# Patient Record
Sex: Female | Born: 1945 | Race: Black or African American | Hispanic: No | Smoking: Former smoker
Health system: Southern US, Community
[De-identification: ages and names within clinical notes are randomized; demographics above are authoritative.]

## PROBLEM LIST (undated history)

## (undated) DIAGNOSIS — I10 Essential (primary) hypertension: Secondary | ICD-10-CM

## (undated) DIAGNOSIS — K5909 Other constipation: Secondary | ICD-10-CM

## (undated) DIAGNOSIS — K219 Gastro-esophageal reflux disease without esophagitis: Secondary | ICD-10-CM

## (undated) DIAGNOSIS — M797 Fibromyalgia: Secondary | ICD-10-CM

## (undated) DIAGNOSIS — Z973 Presence of spectacles and contact lenses: Secondary | ICD-10-CM

## (undated) DIAGNOSIS — M1990 Unspecified osteoarthritis, unspecified site: Secondary | ICD-10-CM

## (undated) DIAGNOSIS — E119 Type 2 diabetes mellitus without complications: Secondary | ICD-10-CM

## (undated) HISTORY — DX: Type 2 diabetes mellitus without complications: E11.9

## (undated) HISTORY — PX: EYE SURGERY: SHX253

## (undated) HISTORY — PX: CYSTOCELE REPAIR: SHX163

## (undated) HISTORY — PX: TUBAL LIGATION: SHX77

## (undated) HISTORY — DX: Essential (primary) hypertension: I10

## (undated) HISTORY — PX: COLONOSCOPY: SHX174

---
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
## 2009-05-15 ENCOUNTER — Emergency Department (HOSPITAL_COMMUNITY): Admission: EM | Admit: 2009-05-15 | Discharge: 2009-05-15 | Payer: Self-pay | Admitting: Family Medicine

## 2010-03-06 ENCOUNTER — Encounter: Admission: RE | Admit: 2010-03-06 | Discharge: 2010-03-06 | Payer: Self-pay | Admitting: Family Medicine

## 2011-08-01 ENCOUNTER — Ambulatory Visit
Admission: RE | Admit: 2011-08-01 | Discharge: 2011-08-01 | Disposition: A | Discharge status: Home / Self Care | Payer: Medicare Other | Source: Ambulatory Visit | Attending: Family Medicine | Admitting: Family Medicine

## 2011-08-01 ENCOUNTER — Other Ambulatory Visit: Payer: Self-pay | Admitting: Family Medicine

## 2011-08-01 DIAGNOSIS — M545 Low back pain: Secondary | ICD-10-CM

## 2011-08-14 ENCOUNTER — Ambulatory Visit (INDEPENDENT_AMBULATORY_CARE_PROVIDER_SITE_OTHER): Payer: Medicare Other | Admitting: Family Medicine

## 2011-08-14 ENCOUNTER — Ambulatory Visit: Payer: Medicare Other

## 2011-08-14 VITALS — BP 233/98 | HR 65 | Temp 98.4°F | Resp 18 | Ht 63.75 in | Wt 242.0 lb

## 2011-08-14 DIAGNOSIS — R0602 Shortness of breath: Secondary | ICD-10-CM

## 2011-08-14 DIAGNOSIS — I10 Essential (primary) hypertension: Secondary | ICD-10-CM

## 2011-08-14 NOTE — Progress Notes (Signed)
  Subjective:    Patient ID: Nicole Abbott, female    DOB: 12-13-1945, 66 y.o.   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  Started toprol for high blood pressure 2 weeks ago and symptoms started then.  Was a change from enalapril because it wasn't working.  States pressure was even worse than it is today.  Has been in 200s for almost a year, per the patient.  Also tried benicar-hctz and didn't tolerate.  MD didn't do any bloodwork.  Patient bothered by her out of control HTN and change in breathing comfort. Still taking the toprol.      Has lived here 2 years.     Review of Systems No chest pain or headache.      Objective:   Physical Exam  Constitutional: She appears well-developed and well-nourished.  Cardiovascular: Normal rate, regular rhythm, normal heart sounds and intact distal pulses.   No murmur heard. Pulmonary/Chest: Effort normal and breath sounds normal. No respiratory distress. She has no wheezes. She has no rales.  Skin: Skin is warm and dry.    EKG:  LVH.  Sinus rhythm.  Normal rate. No S-T changes.   [facility] Primary radiology reading by Dr. Georgiana Shore: Congestion.  Fluid in fissure on right. No discrete effusion      Assessment & Plan:  Hypertension - CHRONIC.  Per patient (and reliable - medically trained) has been running this high for months.  No chest pain, headache.  Evidence of congestion on xray but breathing comfortably, lungs sound clear.  Goal is to lower slowly over several days.  Continue toprol.  Start Lasix 20 daily (has this at home.  Takes 1/2 periodically for LE swelling).  In 3 days resume enalapril (took 20 BID).  Start daily.  Then in 3 more days go BID.  Monitor BPs twice daily and record.  Will touch base with her by phone on Sunday to review labs (CMET to eval liver, kidneys and BNP) and see what BPs are and further advise.  If develops chest pain, ha, vision changes, nausea patient to go to ED immediately.  She  understands and agrees.

## 2011-08-15 LAB — COMPREHENSIVE METABOLIC PANEL
AST: 30 U/L (ref 0–37)
Albumin: 4.2 g/dL (ref 3.5–5.2)
BUN: 12 mg/dL (ref 6–23)
CO2: 31 mEq/L (ref 19–32)
Calcium: 9.8 mg/dL (ref 8.4–10.5)
Chloride: 103 mEq/L (ref 96–112)
Glucose, Bld: 92 mg/dL (ref 70–99)
Potassium: 4.6 mEq/L (ref 3.5–5.3)

## 2011-08-15 LAB — BRAIN NATRIURETIC PEPTIDE: Brain Natriuretic Peptide: 41.5 pg/mL (ref 0.0–100.0)

## 2011-08-16 ENCOUNTER — Other Ambulatory Visit: Payer: Self-pay

## 2011-08-16 ENCOUNTER — Emergency Department (HOSPITAL_COMMUNITY): Payer: Medicare Other

## 2011-08-16 ENCOUNTER — Observation Stay (HOSPITAL_COMMUNITY)
Admission: EM | Admit: 2011-08-16 | Discharge: 2011-08-19 | Disposition: A | Discharge status: Home / Self Care | Payer: Medicare Other | Attending: Internal Medicine | Admitting: Internal Medicine

## 2011-08-16 ENCOUNTER — Encounter (HOSPITAL_COMMUNITY): Payer: Self-pay | Admitting: Emergency Medicine

## 2011-08-16 DIAGNOSIS — I5032 Chronic diastolic (congestive) heart failure: Secondary | ICD-10-CM | POA: Insufficient documentation

## 2011-08-16 DIAGNOSIS — M129 Arthropathy, unspecified: Secondary | ICD-10-CM | POA: Insufficient documentation

## 2011-08-16 DIAGNOSIS — J45909 Unspecified asthma, uncomplicated: Secondary | ICD-10-CM | POA: Diagnosis not present

## 2011-08-16 DIAGNOSIS — R0789 Other chest pain: Principal | ICD-10-CM | POA: Insufficient documentation

## 2011-08-16 DIAGNOSIS — E785 Hyperlipidemia, unspecified: Secondary | ICD-10-CM | POA: Diagnosis present

## 2011-08-16 DIAGNOSIS — I10 Essential (primary) hypertension: Secondary | ICD-10-CM | POA: Diagnosis present

## 2011-08-16 DIAGNOSIS — Z8249 Family history of ischemic heart disease and other diseases of the circulatory system: Secondary | ICD-10-CM | POA: Insufficient documentation

## 2011-08-16 DIAGNOSIS — R079 Chest pain, unspecified: Secondary | ICD-10-CM | POA: Diagnosis present

## 2011-08-16 DIAGNOSIS — K219 Gastro-esophageal reflux disease without esophagitis: Secondary | ICD-10-CM | POA: Insufficient documentation

## 2011-08-16 DIAGNOSIS — K5900 Constipation, unspecified: Secondary | ICD-10-CM | POA: Insufficient documentation

## 2011-08-16 DIAGNOSIS — M1990 Unspecified osteoarthritis, unspecified site: Secondary | ICD-10-CM | POA: Diagnosis present

## 2011-08-16 DIAGNOSIS — R7309 Other abnormal glucose: Secondary | ICD-10-CM | POA: Insufficient documentation

## 2011-08-16 DIAGNOSIS — R0602 Shortness of breath: Secondary | ICD-10-CM | POA: Insufficient documentation

## 2011-08-16 DIAGNOSIS — I509 Heart failure, unspecified: Secondary | ICD-10-CM | POA: Insufficient documentation

## 2011-08-16 HISTORY — DX: Unspecified osteoarthritis, unspecified site: M19.90

## 2011-08-16 LAB — COMPREHENSIVE METABOLIC PANEL
ALT: 23 U/L (ref 0–35)
Albumin: 3.5 g/dL (ref 3.5–5.2)
Alkaline Phosphatase: 81 U/L (ref 39–117)
Glucose, Bld: 175 mg/dL — ABNORMAL HIGH (ref 70–99)
Potassium: 3.8 mEq/L (ref 3.5–5.1)
Sodium: 142 mEq/L (ref 135–145)
Total Protein: 7.1 g/dL (ref 6.0–8.3)

## 2011-08-16 LAB — DIFFERENTIAL
Basophils Relative: 1 % (ref 0–1)
Eosinophils Absolute: 0.2 10*3/uL (ref 0.0–0.7)
Lymphs Abs: 2.3 10*3/uL (ref 0.7–4.0)
Neutro Abs: 2.2 10*3/uL (ref 1.7–7.7)
Neutrophils Relative %: 43 % (ref 43–77)

## 2011-08-16 LAB — CBC
MCH: 27.2 pg (ref 26.0–34.0)
Platelets: 305 10*3/uL (ref 150–400)
RBC: 4.48 MIL/uL (ref 3.87–5.11)

## 2011-08-16 MED ORDER — SODIUM CHLORIDE 0.9 % IV SOLN
INTRAVENOUS | Status: AC
  Administered 2011-08-17: 02:00:00 via INTRAVENOUS

## 2011-08-16 MED ORDER — NITROGLYCERIN 2 % TD OINT
1.0000 [in_us] | TOPICAL_OINTMENT | Freq: Once | TRANSDERMAL | Status: DC
  Filled 2011-08-16: qty 1

## 2011-08-16 NOTE — ED Provider Notes (Signed)
History     CSN: [id number]  Arrival date & time 08/16/11  2118   First MD Initiated Contact with Patient 08/16/11 2150      Chief Complaint  Patient presents with  . Chest Pain    (Consider location/radiation/quality/duration/timing/severity/associated sxs/prior treatment) Patient is a 66 y.o. female presenting with chest pain. The history is provided by the patient (The patient states that she's been having chest pain for last week but worse today and had some shortness of breath and sweating she was given 3 nitroglycerin by the paramedics which helped her pain.). No language interpreter was used.  Chest Pain Chest pain occurs frequently. The chest pain is resolved. The pain is associated with stress. At its most intense, the pain is at 6/10. The pain is currently at 3/10. The quality of the pain is described as aching. The pain does not radiate. Chest pain is worsened by stress. Pertinent negatives for primary symptoms include no fever, no fatigue, no cough and no abdominal pain.  Pertinent negatives for associated symptoms include no claudication.  Pertinent negatives for past medical history include no seizures.     Past Medical History  Diagnosis Date  . Hypertension   . Asthma   . Arthritis     Past Surgical History  Procedure Date  . Tubal ligation     Family History  Problem Relation Age of Onset  . Diabetes Mother   . Hypertension Mother   . Diabetes Sister   . Hypertension Sister     History  Substance Use Topics  . Smoking status: Former Games developer  . Smokeless tobacco: Not on file  . Alcohol Use: Yes     Occasional wine drinker    OB History    Grav Para Term Preterm Abortions TAB SAB Ect Mult Living                  Review of Systems  Constitutional: Negative for fever and fatigue.  HENT: Negative for congestion, sinus pressure and ear discharge.   Eyes: Negative for discharge.  Respiratory: Negative for cough.   Cardiovascular: Positive for  chest pain. Negative for claudication.  Gastrointestinal: Negative for abdominal pain and diarrhea.  Genitourinary: Negative for frequency and hematuria.  Musculoskeletal: Negative for back pain.  Skin: Negative for rash.  Neurological: Negative for seizures and headaches.  Hematological: Negative.   Psychiatric/Behavioral: Negative for hallucinations.    Allergies  Penicillins  Home Medications   Current Outpatient Rx  Name Route Sig Dispense Refill  . DOCUSATE SODIUM-CASANTHRANOL 100-30 MG PO CAPS Oral Take 3 capsules by mouth daily.     . ENALAPRIL MALEATE 20 MG PO TABS Oral Take 20 mg by mouth daily.    Marland Kitchen FAMOTIDINE 10 MG PO CHEW Oral Chew 10 mg by mouth 2 (two) times daily.    . FUROSEMIDE 20 MG PO TABS Oral Take 10 mg by mouth daily.    Marland Kitchen MAGNESIUM OXIDE 400 MG PO TABS Oral Take 400 mg by mouth at bedtime.    Marland Kitchen METOPROLOL TARTRATE 50 MG PO TABS Oral Take 50 mg by mouth 2 (two) times daily.    . MULTI-VITAMIN/MINERALS PO TABS Oral Take 1 tablet by mouth daily.    Marland Kitchen NAPROXEN SODIUM 220 MG PO TABS Oral Take 220 mg by mouth 2 (two) times daily with a meal.      BP 194/69  Pulse 65  Temp 97.9 F (36.6 C)  Resp 16  SpO2 100%  Physical Exam  Constitutional: She is oriented to person, place, and time. She appears well-developed.  HENT:  Head: Normocephalic and atraumatic.  Eyes: Conjunctivae and EOM are normal. No scleral icterus.  Neck: Neck supple. No thyromegaly present.  Cardiovascular: Normal rate and regular rhythm.  Exam reveals no gallop and no friction rub.   No murmur heard. Pulmonary/Chest: No stridor. She has no wheezes. She has no rales. She exhibits no tenderness.  Abdominal: She exhibits no distension. There is no tenderness. There is no rebound.  Musculoskeletal: Normal range of motion. She exhibits no edema.  Lymphadenopathy:    She has no cervical adenopathy.  Neurological: She is oriented to person, place, and time. Coordination normal.  Skin: No rash  noted. No erythema.  Psychiatric: She has a normal mood and affect. Her behavior is normal.    ED Course  Procedures (including critical care time)  Labs Reviewed  COMPREHENSIVE METABOLIC PANEL - Abnormal; Notable for the following:    Glucose, Bld 175 (*)    Total Bilirubin 0.1 (*)    GFR calc non Af Amer 85 (*)    All other components within normal limits  CBC  DIFFERENTIAL  POCT I-STAT TROPONIN I   Dg Chest Portable 1 View  08/16/2011  *RADIOLOGY REPORT*  Clinical Data: Shortness of breath.  PORTABLE CHEST - 1 VIEW  Comparison: 08/14/2011  Findings: Single view of the chest was obtained.  There is haziness in the lower lungs probably related to technique and soft tissues. There is no focal airspace disease.  Vascular structures are similar to the prior examination.  Heart size is stable and upper limits of normal.  No evidence for pneumothorax.  IMPRESSION: Haziness in the lower lungs is probably related to overlying soft tissues rather than a pulmonary process.  Minimal change from the prior examination and no focal disease.  Original Report Authenticated By: Richarda Overlie, M.D.     1. Chest pain    Date: 08/16/2011  Rate: 71  Rhythm: normal sinus rhythm  QRS Axis: normal  Intervals: normal  ST/T Wave abnormalities: nonspecific ST changes  Conduction Disutrbances:none  Narrative Interpretation:   Old EKG Reviewed: none available      MDM  Chest pain rule out cad        Benny Lennert, MD 08/16/11 2344

## 2011-08-16 NOTE — ED Notes (Addendum)
Per EMS:  Pt has been having CP for the past two weeks.  Went to Urgent Care and was told EKG was abnormal and referred to her PCM.  CP started tonight @ 2000 - administered 3 Nitro and 325 mg of asprin.  Pain radiates to left jaw, neck, arm, and back.  Rates pain 7/10.  Also has been having SOB.  Pt was placed on 2L of O2 via [place]  Urgent care stated she had fluid in heart and was started on toprol.  VS:  220/107 (per EMS)

## 2011-08-17 ENCOUNTER — Encounter (HOSPITAL_COMMUNITY): Payer: Self-pay | Admitting: *Deleted

## 2011-08-17 DIAGNOSIS — J45909 Unspecified asthma, uncomplicated: Secondary | ICD-10-CM | POA: Diagnosis not present

## 2011-08-17 DIAGNOSIS — R079 Chest pain, unspecified: Secondary | ICD-10-CM | POA: Diagnosis present

## 2011-08-17 DIAGNOSIS — M1990 Unspecified osteoarthritis, unspecified site: Secondary | ICD-10-CM | POA: Diagnosis present

## 2011-08-17 DIAGNOSIS — E785 Hyperlipidemia, unspecified: Secondary | ICD-10-CM | POA: Diagnosis present

## 2011-08-17 DIAGNOSIS — I10 Essential (primary) hypertension: Secondary | ICD-10-CM | POA: Diagnosis present

## 2011-08-17 LAB — CBC
HCT: 35.5 % — ABNORMAL LOW (ref 36.0–46.0)
HCT: 35.7 % — ABNORMAL LOW (ref 36.0–46.0)
MCH: 26.8 pg (ref 26.0–34.0)
MCH: 27.2 pg (ref 26.0–34.0)
MCHC: 33.2 g/dL (ref 30.0–36.0)
MCV: 80.7 fL (ref 78.0–100.0)
MCV: 81.5 fL (ref 78.0–100.0)
Platelets: 297 10*3/uL (ref 150–400)
RDW: 15 % (ref 11.5–15.5)
RDW: 15.2 % (ref 11.5–15.5)
WBC: 4.5 10*3/uL (ref 4.0–10.5)

## 2011-08-17 LAB — CARDIAC PANEL(CRET KIN+CKTOT+MB+TROPI)
CK, MB: 2.4 ng/mL (ref 0.3–4.0)
CK, MB: 2.3 ng/mL (ref 0.3–4.0)
Relative Index: 2.4 (ref 0.0–2.5)
Troponin I: <0.3 ng/mL (ref <0.30)
Troponin I: <0.3 ng/mL (ref <0.30)

## 2011-08-17 LAB — BASIC METABOLIC PANEL
BUN: 11 mg/dL (ref 6–23)
CO2: 28 mEq/L (ref 19–32)
Calcium: 9 mg/dL (ref 8.4–10.5)
Chloride: 104 mEq/L (ref 96–112)
Creatinine, Ser: 0.69 mg/dL (ref 0.50–1.10)
Glucose, Bld: 119 mg/dL — ABNORMAL HIGH (ref 70–99)

## 2011-08-17 LAB — CREATININE, SERUM: GFR calc Af Amer: >90 mL/min (ref >90)

## 2011-08-17 MED ORDER — HYDRALAZINE HCL 20 MG/ML IJ SOLN
10.0000 mg | Freq: Four times a day (QID) | INTRAMUSCULAR | Status: DC | PRN
  Filled 2011-08-17: qty 0.5

## 2011-08-17 MED ORDER — SODIUM CHLORIDE 0.9 % IJ SOLN
3.0000 mL | Freq: Two times a day (BID) | INTRAMUSCULAR | Status: DC
  Administered 2011-08-17 – 2011-08-19 (×4): 3 mL via INTRAVENOUS

## 2011-08-17 MED ORDER — OXYCODONE HCL 5 MG PO TABS: 5.0000 mg | ORAL_TABLET | ORAL | Status: DC | PRN

## 2011-08-17 MED ORDER — PANTOPRAZOLE SODIUM 40 MG IV SOLR
40.0000 mg | INTRAVENOUS | Status: DC
  Filled 2011-08-17: qty 40

## 2011-08-17 MED ORDER — CARVEDILOL 12.5 MG PO TABS
12.5000 mg | ORAL_TABLET | Freq: Two times a day (BID) | ORAL | Status: DC
  Administered 2011-08-17 – 2011-08-19 (×4): 12.5 mg via ORAL
  Filled 2011-08-17 (×6): qty 1

## 2011-08-17 MED ORDER — PANTOPRAZOLE SODIUM 40 MG IV SOLR
40.0000 mg | Freq: Two times a day (BID) | INTRAVENOUS | Status: DC
  Filled 2011-08-17: qty 40

## 2011-08-17 MED ORDER — ENOXAPARIN SODIUM 40 MG/0.4ML ~~LOC~~ SOLN
40.0000 mg | SUBCUTANEOUS | Status: DC
  Administered 2011-08-17 – 2011-08-19 (×3): 40 mg via SUBCUTANEOUS
  Filled 2011-08-17 (×3): qty 0.4

## 2011-08-17 MED ORDER — NITROGLYCERIN 2 % TD OINT
1.0000 [in_us] | TOPICAL_OINTMENT | Freq: Four times a day (QID) | TRANSDERMAL | Status: DC
  Filled 2011-08-17 (×2): qty 30

## 2011-08-17 MED ORDER — DOCUSATE SODIUM 100 MG PO CAPS
100.0000 mg | ORAL_CAPSULE | Freq: Two times a day (BID) | ORAL | Status: DC
  Administered 2011-08-17 – 2011-08-18 (×4): 100 mg via ORAL
  Filled 2011-08-17 (×6): qty 1

## 2011-08-17 MED ORDER — HYDROMORPHONE HCL PF 1 MG/ML IJ SOLN: 0.5000 mg | INTRAMUSCULAR | Status: DC | PRN

## 2011-08-17 MED ORDER — ACETAMINOPHEN 650 MG RE SUPP: 650.0000 mg | Freq: Four times a day (QID) | RECTAL | Status: DC | PRN

## 2011-08-17 MED ORDER — ONDANSETRON HCL 4 MG/2ML IJ SOLN: 4.0000 mg | Freq: Four times a day (QID) | INTRAMUSCULAR | Status: DC | PRN

## 2011-08-17 MED ORDER — SODIUM CHLORIDE 0.9 % IJ SOLN
3.0000 mL | Freq: Two times a day (BID) | INTRAMUSCULAR | Status: DC
  Administered 2011-08-17: 3 mL via INTRAVENOUS

## 2011-08-17 MED ORDER — SODIUM CHLORIDE 0.9 % IV SOLN: 250.0000 mL | INTRAVENOUS | Status: DC | PRN

## 2011-08-17 MED ORDER — ALUM & MAG HYDROXIDE-SIMETH 200-200-20 MG/5ML PO SUSP: 30.0000 mL | Freq: Four times a day (QID) | ORAL | Status: DC | PRN

## 2011-08-17 MED ORDER — OXYCODONE HCL 5 MG PO TABS: 5.0000 mg | ORAL_TABLET | Freq: Four times a day (QID) | ORAL | Status: DC | PRN

## 2011-08-17 MED ORDER — HYDRALAZINE HCL 20 MG/ML IJ SOLN
10.0000 mg | Freq: Four times a day (QID) | INTRAMUSCULAR | Status: DC | PRN
  Administered 2011-08-17 (×2): 10 mg via INTRAVENOUS
  Filled 2011-08-17 (×3): qty 0.5

## 2011-08-17 MED ORDER — ONDANSETRON HCL 4 MG PO TABS: 4.0000 mg | ORAL_TABLET | Freq: Four times a day (QID) | ORAL | Status: DC | PRN

## 2011-08-17 MED ORDER — SODIUM CHLORIDE 0.9 % IJ SOLN: 3.0000 mL | INTRAMUSCULAR | Status: DC | PRN

## 2011-08-17 MED ORDER — FUROSEMIDE 20 MG PO TABS
20.0000 mg | ORAL_TABLET | Freq: Every day | ORAL | Status: DC
  Administered 2011-08-17 – 2011-08-19 (×3): 20 mg via ORAL
  Filled 2011-08-17 (×3): qty 1

## 2011-08-17 MED ORDER — ZOLPIDEM TARTRATE 5 MG PO TABS
5.0000 mg | ORAL_TABLET | Freq: Every evening | ORAL | Status: DC | PRN
  Administered 2011-08-17 – 2011-08-18 (×2): 5 mg via ORAL
  Filled 2011-08-17 (×2): qty 1

## 2011-08-17 MED ORDER — ASPIRIN 325 MG PO TABS
325.0000 mg | ORAL_TABLET | Freq: Every day | ORAL | Status: DC
  Administered 2011-08-17 – 2011-08-19 (×3): 325 mg via ORAL
  Filled 2011-08-17 (×3): qty 1

## 2011-08-17 MED ORDER — ACETAMINOPHEN 325 MG PO TABS
650.0000 mg | ORAL_TABLET | Freq: Four times a day (QID) | ORAL | Status: DC | PRN
  Administered 2011-08-17 – 2011-08-19 (×7): 650 mg via ORAL
  Filled 2011-08-17 (×4): qty 2

## 2011-08-17 MED ORDER — POLYETHYLENE GLYCOL 3350 17 G PO PACK
17.0000 g | PACK | Freq: Every day | ORAL | Status: DC
  Administered 2011-08-17 – 2011-08-18 (×2): 17 g via ORAL
  Filled 2011-08-17 (×3): qty 1

## 2011-08-17 MED ORDER — PANTOPRAZOLE SODIUM 40 MG PO TBEC
40.0000 mg | DELAYED_RELEASE_TABLET | Freq: Every day | ORAL | Status: DC
  Administered 2011-08-17 – 2011-08-19 (×3): 40 mg via ORAL
  Filled 2011-08-17 (×2): qty 1

## 2011-08-17 MED ORDER — LISINOPRIL 20 MG PO TABS
20.0000 mg | ORAL_TABLET | Freq: Every day | ORAL | Status: DC
  Administered 2011-08-17 – 2011-08-18 (×2): 20 mg via ORAL
  Filled 2011-08-17 (×2): qty 1

## 2011-08-17 NOTE — Progress Notes (Signed)
Subjective: Currently no CP; no SOB, good air movement. Patient feeling a little woozy (after NTG, hydralazine and pain meds). No other acute complaints.  Objective: Vital signs in last 24 hours: Temp:  [97.4 F (36.3 C)-99.3 F (37.4 C)] 97.4 F (36.3 C) (02/17 0501) Pulse Rate:  [50-68] 68  (02/17 0501) Resp:  [9-19] 19  (02/17 0501) BP: (134-219)/(48-100) 170/64 mmHg (02/17 1130) SpO2:  [98 %-100 %] 98 % (02/17 0501) Weight:  [109.8 kg (242 lb 1 oz)] 109.8 kg (242 lb 1 oz) (02/17 0131) Weight change:  Last BM Date: 08/16/11  Intake/Output from previous day: 02/16 0701 - 02/17 0700 In: 1243 [P.O.:1090; I.V.:153] Out: -  Total I/O In: 850 [P.O.:600; I.V.:250] Out: 1550 [Urine:1550]   Physical Exam: General: Alert, awake, oriented x3, in no acute distress. HEENT: No bruits, no goiter. Heart: Regular rate and rhythm, without murmurs, rubs, gallops. Lungs: Clear to auscultation bilaterally. Abdomen: Soft, nontender, nondistended, positive bowel sounds. Extremities: Trace edema bilaterally. No cyanosis or clubbing Neuro: Grossly intact, nonfocal.   Lab Results: Basic Metabolic Panel:  Basename 08/17/11 0607 08/17/11 0037 08/16/11 2203  NA 141 -- 142  K 3.5 -- 3.8  CL 104 -- 104  CO2 28 -- 30  GLUCOSE 119* -- 175*  BUN 11 -- 11  CREATININE 0.69 0.72 --  CALCIUM 9.0 -- 9.3  MG -- -- --  PHOS -- -- --   Liver Function Tests:  [facility] 08/16/11 2203 08/14/11 1658  AST 29 30  ALT 23 23  ALKPHOS 81 89  BILITOT 0.1* 0.3  PROT 7.1 7.3  ALBUMIN 3.5 4.2   CBC:  Basename 08/17/11 0607 08/17/11 0037 08/16/11 2203  WBC 4.5 5.1 --  NEUTROABS -- -- 2.2  HGB 11.9* 11.8* --  HCT 35.7* 35.5* --  MCV 81.5 80.7 --  PLT 291 297 --   Cardiac Enzymes:  Basename 08/17/11 0845 08/17/11 0037  CKTOTAL 101 103  CKMB 2.4 2.6  CKMBINDEX -- --  TROPONINI <0.30 <0.30    Studies/Results: Dg Chest Portable 1 View  08/16/2011  *RADIOLOGY REPORT*  Clinical Data: Shortness  of breath.  PORTABLE CHEST - 1 VIEW  Comparison: 08/14/2011  Findings: Single view of the chest was obtained.  There is haziness in the lower lungs probably related to technique and soft tissues. There is no focal airspace disease.  Vascular structures are similar to the prior examination.  Heart size is stable and upper limits of normal.  No evidence for pneumothorax.  IMPRESSION: Haziness in the lower lungs is probably related to overlying soft tissues rather than a pulmonary process.  Minimal change from the prior examination and no focal disease.  Original Report Authenticated By: Richarda Overlie, M.D.    Medications: Scheduled Meds:   . sodium chloride   Intravenous STAT  . aspirin  325 mg Oral Daily  . carvedilol  12.5 mg Oral BID WC  . docusate sodium  100 mg Oral BID  . enoxaparin  40 mg Subcutaneous Q24H  . furosemide  20 mg Oral Daily  . lisinopril  20 mg Oral Daily  . nitroGLYCERIN  1 inch Topical Once  . pantoprazole (PROTONIX) IV  40 mg Intravenous Q12H  . polyethylene glycol  17 g Oral Daily  . sodium chloride  3 mL Intravenous Q12H  . sodium chloride  3 mL Intravenous Q12H  . DISCONTD: nitroGLYCERIN  1 inch Topical Q6H  . DISCONTD: pantoprazole (PROTONIX) IV  40 mg Intravenous Q24H   Continuous Infusions:  PRN Meds:.sodium chloride, acetaminophen, acetaminophen, alum & mag hydroxide-simeth, hydrALAZINE, ondansetron (ZOFRAN) IV, ondansetron, oxyCODONE, sodium chloride, zolpidem, DISCONTD: hydrALAZINE, DISCONTD: HYDROmorphone, DISCONTD: oxyCODONE  Assessment/Plan: 1-Chest pain: atypical but with significant risk fx's. Will continue w/u to rule out ACS. So far negative. Will also continue PPI, BID now.  2-Acelerated Hypertension: will continue hydralazine PRN; but will start maintenance control using coreg, lisinopril and lasix (last one she was already on it at home). Follow results.  3-Arthritis: continue PRN analgesics.  4-Hyperlipidemia: Will check lipid profile and will  start statins depending results.  5-GERD: continue PPI; but will change to BID.  6-Asthma: stable' continue PRN albuterol.  7-Hyperglycemia w/o Hx of DM: will repeat fasting glucose and check A1C.  8-DVT: Lovenox    LOS: 1 day   Martena Emanuele [facility] [phone]  08/17/2011, 1:48 PM

## 2011-08-17 NOTE — ED Notes (Signed)
Patient reports intermitant chest pain x 2 weeks states worst today currently pain free but states pain in substernal and goes thru her back.

## 2011-08-17 NOTE — Progress Notes (Signed)
NOTIFIED OF QUESTIONABLE ST/? BIGEMINY ON MONITOR.  PATIENT HAVE SEVERAL LEADS OFF AND WAS MOVING IN BED TRYING TO GET COMFORTABLE. PATIENT DENIES ANY C/O PAIN. VITAL SIGNS STABLE.  NEW ELECTRODES PLACED-PATIENT SINUS RHYTHM ON MONITOR CURRENTLY WITH NO COMPLAINTS.  STRIP PRINTED AND ADDED TO CHART. WILL CONTINUE TO MONITOR.  Tobias Alexander

## 2011-08-17 NOTE — H&P (Addendum)
DATE OF ADMISSION:  08/17/2011  PCP:   Karie Chimera, MD, MD   Chief Complaint: Chest Pain   HPI: Nicole Abbott is an 66 y.o. female who complains of having intermittent chest pain over the past 2 weeks worse tonight.  She reports having chest pressure and discomfort that at times would radiate into her back and up her neck into her face.  She reports having SOB, but denies having nausea vomtiing or diaphoresis.  This evening the pain was unrelieved until she had been given 3 nitroglycerin tablets by EMS.  She does have hypertension, and hyperlipidemia and a strong family history of CAD.  Her ED workup was negative so far and she was referred for medical admission.    Past Medical History  Diagnosis Date  . Hypertension   . Asthma   . Arthritis     Past Surgical History  Procedure Date  . Tubal ligation     Medications:  HOME MEDS: Prior to Admission medications   Medication Sig Start Date End Date Taking? Authorizing Provider  docusate-casanthranol (PERICOLACE) 100-30 MG per capsule Take 3 capsules by mouth daily.    Yes Historical Provider, MD  enalapril (VASOTEC) 20 MG tablet Take 20 mg by mouth daily.   Yes Historical Provider, MD  famotidine (PEPCID AC) 10 MG chewable tablet Chew 10 mg by mouth 2 (two) times daily.   Yes Historical Provider, MD  furosemide (LASIX) 20 MG tablet Take 10 mg by mouth daily.   Yes Historical Provider, MD  magnesium oxide (MAG-OX) 400 MG tablet Take 400 mg by mouth at bedtime.   Yes Historical Provider, MD  metoprolol (LOPRESSOR) 50 MG tablet Take 50 mg by mouth 2 (two) times daily.   Yes Historical Provider, MD  Multiple Vitamins-Minerals (MULTIVITAMIN WITH MINERALS) tablet Take 1 tablet by mouth daily.   Yes Historical Provider, MD  naproxen sodium (ANAPROX) 220 MG tablet Take 220 mg by mouth 2 (two) times daily with a meal.   Yes Historical Provider, MD    Allergies:  Allergies  Allergen Reactions  . Penicillins Anaphylaxis    Social  History:   reports that she has quit smoking. She does not have any smokeless tobacco history on file. She reports that she drinks alcohol. Her drug history not on file.  Family History: Family History  Problem Relation Age of Onset  . Diabetes Mother   . Hypertension Mother   . Diabetes Sister   . Hypertension Sister     Review of Systems:  The patient denies anorexia, fever, weight loss, vision loss, decreased hearing, hoarseness, syncope, dyspnea on exertion, peripheral edema, balance deficits, hemoptysis, abdominal pain, melena, hematochezia, severe indigestion/heartburn, hematuria, incontinence, genital sores, muscle weakness, suspicious skin lesions, transient blindness, difficulty walking, depression, unusual weight change, abnormal bleeding, enlarged lymph nodes, angioedema, and breast masses.   Physical Exam:  GEN:  Pleasant examined  and in no acute distress; cooperative with exam Filed Vitals:   08/16/11 2356 08/17/11 0000 08/17/11 0030 08/17/11 0131  BP: 150/48 155/49 175/66 195/100  Pulse: 52 57 52 59  Temp:    99.3 F (37.4 C)  TempSrc:    Oral  Resp:  19 16 18   Height:    5\' 4"  (1.626 m)  Weight:    109.8 kg (242 lb 1 oz)  SpO2: 99% 99% 100% 98%   Blood pressure 195/100, pulse 59, temperature 99.3 F (37.4 C), temperature source Oral, resp. rate 18, height 5\' 4"  (1.626 m), weight 109.8  kg (242 lb 1 oz), SpO2 98.00%. PSYCH: She is alert and oriented x4; does not appear anxious does not appear depressed; affect is normal HEENT: Normocephalic and Atraumatic, Mucous membranes pink; PERRLA; EOM intact; Fundi:  Benign;  No scleral icterus, Nares: Patent, Oropharynx: Clear,Fair Dentition, Neck:  FROM, no cervical lymphadenopathy nor thyromegaly or carotid bruit; no JVD; Breasts:: Not examined CHEST WALL: No tenderness CHEST: Normal respiration, clear to auscultation bilaterally HEART: Regular rate and rhythm; no murmurs rubs or gallops BACK: No kyphosis or scoliosis; no  CVA tenderness ABDOMEN: Positive Bowel Sounds, Obese, soft non-tender; no masses, no organomegaly, no pannus; no intertriginous candida. Rectal Exam: Not done EXTREMITIES: No bone or joint deformity; age-appropriate arthropathy of the hands and knees; no cyanosis, clubbing or edema; no ulcerations. Genitalia: not examined PULSES: 2+ and symmetric SKIN: Normal hydration no rash or ulceration CNS: Cranial nerves 2-12 grossly intact no focal neurologic deficit   Labs & Imaging Results for orders placed during the hospital encounter of 08/16/11 (from the past 48 hour(s))  CBC     Status: Normal   Collection Time   08/16/11 10:03 PM      Component Value Range Comment   WBC 5.0  4.0 - 10.5 (K/uL)    RBC 4.48  3.87 - 5.11 (MIL/uL)    Hemoglobin 12.2  12.0 - 15.0 (g/dL)    HCT 16.1  09.6 - 04.5 (%)    MCV 81.3  78.0 - 100.0 (fL)    MCH 27.2  26.0 - 34.0 (pg)    MCHC 33.5  30.0 - 36.0 (g/dL)    RDW 40.9  81.1 - 91.4 (%)    Platelets 305  150 - 400 (K/uL)   DIFFERENTIAL     Status: Normal   Collection Time   08/16/11 10:03 PM      Component Value Range Comment   Neutrophils Relative 43  43 - 77 (%)    Neutro Abs 2.2  1.7 - 7.7 (K/uL)    Lymphocytes Relative 46  12 - 46 (%)    Lymphs Abs 2.3  0.7 - 4.0 (K/uL)    Monocytes Relative 6  3 - 12 (%)    Monocytes Absolute 0.3  0.1 - 1.0 (K/uL)    Eosinophils Relative 4  0 - 5 (%)    Eosinophils Absolute 0.2  0.0 - 0.7 (K/uL)    Basophils Relative 1  0 - 1 (%)    Basophils Absolute 0.0  0.0 - 0.1 (K/uL)   COMPREHENSIVE METABOLIC PANEL     Status: Abnormal   Collection Time   08/16/11 10:03 PM      Component Value Range Comment   Sodium 142  135 - 145 (mEq/L)    Potassium 3.8  3.5 - 5.1 (mEq/L)    Chloride 104  96 - 112 (mEq/L)    CO2 30  19 - 32 (mEq/L)    Glucose, Bld 175 (*) 70 - 99 (mg/dL)    BUN 11  6 - 23 (mg/dL)    Creatinine, Ser 7.82  0.50 - 1.10 (mg/dL)    Calcium 9.3  8.4 - 10.5 (mg/dL)    Total Protein 7.1  6.0 - 8.3 (g/dL)      Albumin 3.5  3.5 - 5.2 (g/dL)    AST 29  0 - 37 (U/L) HEMOLYSIS AT THIS LEVEL MAY AFFECT RESULT   ALT 23  0 - 35 (U/L)    Alkaline Phosphatase 81  39 - 117 (U/L)  Total Bilirubin 0.1 (*) 0.3 - 1.2 (mg/dL)    GFR calc non Af Amer 85 (*) >90 (mL/min)    GFR calc Af Amer >90  >90 (mL/min)   POCT I-STAT TROPONIN I     Status: Normal   Collection Time   08/16/11 10:21 PM      Component Value Range Comment   Troponin i, poc 0.01  0.00 - 0.08 (ng/mL)    Comment 3            CBC     Status: Abnormal   Collection Time   08/17/11 12:37 AM      Component Value Range Comment   WBC 5.1  4.0 - 10.5 (K/uL)    RBC 4.40  3.87 - 5.11 (MIL/uL)    Hemoglobin 11.8 (*) 12.0 - 15.0 (g/dL)    HCT 96.0 (*) 45.4 - 46.0 (%)    MCV 80.7  78.0 - 100.0 (fL)    MCH 26.8  26.0 - 34.0 (pg)    MCHC 33.2  30.0 - 36.0 (g/dL)    RDW 09.8  11.9 - 14.7 (%)    Platelets 297  150 - 400 (K/uL)   CREATININE, SERUM     Status: Abnormal   Collection Time   08/17/11 12:37 AM      Component Value Range Comment   Creatinine, Ser 0.72  0.50 - 1.10 (mg/dL)    GFR calc non Af Amer 87 (*) >90 (mL/min)    GFR calc Af Amer >90  >90 (mL/min)   CARDIAC PANEL(CRET KIN+CKTOT+MB+TROPI)     Status: Normal   Collection Time   08/17/11 12:37 AM      Component Value Range Comment   Total CK 103  7 - 177 (U/L)    CK, MB 2.6  0.3 - 4.0 (ng/mL)    Troponin I <0.30  <0.30 (ng/mL)    Relative Index 2.5  0.0 - 2.5     Dg Chest Portable 1 View  08/16/2011  *RADIOLOGY REPORT*  Clinical Data: Shortness of breath.  PORTABLE CHEST - 1 VIEW  Comparison: 08/14/2011  Findings: Single view of the chest was obtained.  There is haziness in the lower lungs probably related to technique and soft tissues. There is no focal airspace disease.  Vascular structures are similar to the prior examination.  Heart size is stable and upper limits of normal.  No evidence for pneumothorax.  IMPRESSION: Haziness in the lower lungs is probably related to overlying  soft tissues rather than a pulmonary process.  Minimal change from the prior examination and no focal disease.  Original Report Authenticated By: Richarda Overlie, M.D.    EKG: X 2 , Normal Sinus Rhythm with First Degree AVB, without Acute ST segment changes     Assessment: Present on Admission:  .Chest pain .Hypertension .Arthritis .Hyperlipidemia   Plan:  Admitted to a Telemetry Bed to 23 Hour Observation for a Cardiac Rule Out Nitropaste O2. ASA Cardiac Enzymes Reconcile Home Meds Check fasting lipids Other plans as per orders.   DVT prophylaxis  CODE STATUS:      FULL CODE        Sylis Ketchum C 08/17/2011, 1:57 AM

## 2011-08-17 NOTE — Progress Notes (Signed)
PATIENT ARRIVED TO UNIT FROM ED VIA STRETCHER. PATIENT ALERT AND ORIENTED, VITAL SIGNS STABLE WITH THE EXCEPTION OF ELEVATED BP. PATIENT HAVING C/O HEADACHE.  MD NOTIFIED OF BP, RECEIVED PRN ORDER. TYLENOL GIVEN FOR HEADACHE. SAFETY VIDEO VIEWED, CALL BUTTON WITHIN REACH. WILL CONTINUE TO MONITOR. Nicole Abbott

## 2011-08-18 LAB — BASIC METABOLIC PANEL
BUN: 13 mg/dL (ref 6–23)
CO2: 27 mEq/L (ref 19–32)
Chloride: 105 mEq/L (ref 96–112)
Creatinine, Ser: 0.78 mg/dL (ref 0.50–1.10)
GFR calc Af Amer: >90 mL/min (ref >90)

## 2011-08-18 LAB — CBC
HCT: 35.7 % — ABNORMAL LOW (ref 36.0–46.0)
MCV: 79.9 fL (ref 78.0–100.0)
RDW: 15.2 % (ref 11.5–15.5)
WBC: 4.8 10*3/uL (ref 4.0–10.5)

## 2011-08-18 LAB — LIPID PANEL
LDL Cholesterol: 109 mg/dL — ABNORMAL HIGH (ref 0–99)
Total CHOL/HDL Ratio: 2.9 RATIO
VLDL: 13 mg/dL (ref 0–40)

## 2011-08-18 MED ORDER — BISACODYL 10 MG RE SUPP
10.0000 mg | Freq: Every day | RECTAL | Status: DC | PRN
  Administered 2011-08-18 – 2011-08-19 (×2): 10 mg via RECTAL
  Filled 2011-08-18 (×2): qty 1

## 2011-08-18 MED ORDER — LISINOPRIL 40 MG PO TABS
40.0000 mg | ORAL_TABLET | Freq: Every day | ORAL | Status: DC
  Administered 2011-08-19: 40 mg via ORAL
  Filled 2011-08-18: qty 1

## 2011-08-18 NOTE — Progress Notes (Signed)
Utilization review complete 

## 2011-08-18 NOTE — Progress Notes (Signed)
  Echocardiogram 2D Echocardiogram has been performed.  Esai Stecklein, Real Cons 08/18/2011, 9:53 AM

## 2011-08-18 NOTE — Progress Notes (Signed)
Subjective: Currently no CP; no SOB, good air movement. Patient also denies any HA and dizziness.  Objective: Vital signs in last 24 hours: Temp:  [97.7 F (36.5 C)-99 F (37.2 C)] 99 F (37.2 C) (02/18 1345) Pulse Rate:  [61-77] 76  (02/18 1745) Resp:  [18-19] 18  (02/18 1600) BP: (160-179)/(60-80) 170/80 mmHg (02/18 1745) SpO2:  [95 %-99 %] 97 % (02/18 1600) Weight:  [108.4 kg (238 lb 15.7 oz)] 108.4 kg (238 lb 15.7 oz) (02/18 0642) Weight change: -1.4 kg (-3 lb 1.4 oz) Last BM Date: 08/17/11  Intake/Output from previous day: 02/17 0701 - 02/18 0700 In: 1730 [P.O.:1380; I.V.:350] Out: 2651 [Urine:2650; Stool:1] Total I/O In: 603 [P.O.:600; I.V.:3] Out: 2550 [Urine:2550]   Physical Exam: General: Alert, awake, oriented x3, in no acute distress. HEENT: No bruits, no goiter. Heart: Regular rate and rhythm, without murmurs, rubs, gallops. Lungs: Clear to auscultation bilaterally. Abdomen: Soft, nontender, nondistended, positive bowel sounds. Extremities: Trace edema bilaterally. No cyanosis or clubbing Neuro: Grossly intact, nonfocal.   Lab Results: Basic Metabolic Panel:  Basename 08/18/11 0530 08/17/11 0607  NA 141 141  K 3.7 3.5  CL 105 104  CO2 27 28  GLUCOSE 116* 119*  BUN 13 11  CREATININE 0.78 0.69  CALCIUM 9.2 9.0  MG -- --  PHOS -- --   Liver Function Tests:  [facility] 08/16/11 2203  AST 29  ALT 23  ALKPHOS 81  BILITOT 0.1*  PROT 7.1  ALBUMIN 3.5   CBC:  Basename 08/18/11 0530 08/17/11 0607 08/16/11 2203  WBC 4.8 4.5 --  NEUTROABS -- -- 2.2  HGB 12.0 11.9* --  HCT 35.7* 35.7* --  MCV 79.9 81.5 --  PLT 295 291 --   Cardiac Enzymes:  Basename 08/17/11 1620 08/17/11 0845 08/17/11 0037  CKTOTAL 98 101 103  CKMB 2.3 2.4 2.6  CKMBINDEX -- -- --  TROPONINI <0.30 <0.30 <0.30    Studies/Results: Dg Chest Portable 1 View  08/16/2011  *RADIOLOGY REPORT*  Clinical Data: Shortness of breath.  PORTABLE CHEST - 1 VIEW  Comparison: 08/14/2011   Findings: Single view of the chest was obtained.  There is haziness in the lower lungs probably related to technique and soft tissues. There is no focal airspace disease.  Vascular structures are similar to the prior examination.  Heart size is stable and upper limits of normal.  No evidence for pneumothorax.  IMPRESSION: Haziness in the lower lungs is probably related to overlying soft tissues rather than a pulmonary process.  Minimal change from the prior examination and no focal disease.  Original Report Authenticated By: Richarda Overlie, M.D.    Medications: Scheduled Meds:    . aspirin  325 mg Oral Daily  . carvedilol  12.5 mg Oral BID WC  . docusate sodium  100 mg Oral BID  . enoxaparin  40 mg Subcutaneous Q24H  . furosemide  20 mg Oral Daily  . lisinopril  40 mg Oral Daily  . nitroGLYCERIN  1 inch Topical Once  . pantoprazole  40 mg Oral Q1200  . polyethylene glycol  17 g Oral Daily  . sodium chloride  3 mL Intravenous Q12H  . DISCONTD: lisinopril  20 mg Oral Daily  . DISCONTD: pantoprazole (PROTONIX) IV  40 mg Intravenous Q12H  . DISCONTD: sodium chloride  3 mL Intravenous Q12H   Continuous Infusions:  PRN Meds:.sodium chloride, acetaminophen, acetaminophen, alum & mag hydroxide-simeth, hydrALAZINE, ondansetron (ZOFRAN) IV, ondansetron, oxyCODONE, sodium chloride, zolpidem  Assessment/Plan: 1-Chest pain: atypical  but with significant risk fx's. CE'z negative X3 and no abnormalities on telemetry. 2-D echo pending. Will follow echo results, continue PPI and tx for risk factors.  2-Acelerated Hypertension: will continue hydralazine PRN; will adjust lisinopril to 40mg  daily and will continue coreg and lasix.  3-Arthritis: continue PRN analgesics.  4-Hyperlipidemia: no needs of statins; continue low fat heart healthy diet.  5-GERD: continue PPI.  6-Asthma: stable' continue PRN albuterol.  7-Hyperglycemia w/o Hx of DM: A1C of 6.1 and CBG's in the 119's range. Patient qualify for  pre-diabetes. Will continue low carb diet; no need for hypoglycemic agents at this point.  8-DVT: Lovenox  Disposition: adjust antihypertensive agents a little more today, follow echo results and BMET in am; most likely home tomorrow.    LOS: 2 days   Leyton Brownlee [facility] [phone]  08/18/2011, 6:39 PM

## 2011-08-18 NOTE — Progress Notes (Signed)
No note added

## 2011-08-19 ENCOUNTER — Telehealth: Payer: Self-pay

## 2011-08-19 LAB — BASIC METABOLIC PANEL
GFR calc Af Amer: >90 mL/min (ref >90)
GFR calc non Af Amer: 87 mL/min — ABNORMAL LOW (ref >90)
Potassium: 3.7 mEq/L (ref 3.5–5.1)
Sodium: 140 mEq/L (ref 135–145)

## 2011-08-19 MED ORDER — LISINOPRIL 40 MG PO TABS: 40.0000 mg | ORAL_TABLET | Freq: Every day | ORAL | Status: DC

## 2011-08-19 MED ORDER — MAGNESIUM HYDROXIDE 400 MG/5ML PO SUSP: 15.0000 mL | Freq: Two times a day (BID) | ORAL | Status: DC | PRN

## 2011-08-19 MED ORDER — ASPIRIN 81 MG PO TABS: 81.0000 mg | ORAL_TABLET | Freq: Every day | ORAL | Status: DC

## 2011-08-19 MED ORDER — FUROSEMIDE 20 MG PO TABS: 20.0000 mg | ORAL_TABLET | Freq: Every day | ORAL | Status: DC

## 2011-08-19 MED ORDER — FAMOTIDINE 10 MG PO CHEW: 20.0000 mg | CHEWABLE_TABLET | Freq: Two times a day (BID) | ORAL | Status: DC

## 2011-08-19 MED ORDER — CARVEDILOL 12.5 MG PO TABS: 25.0000 mg | ORAL_TABLET | Freq: Two times a day (BID) | ORAL | Status: DC

## 2011-08-19 NOTE — Discharge Summary (Signed)
Physician Discharge Summary  Patient ID: Nicole Abbott MRN: [id number] DOB/AGE: Jan 16, 1946 66 y.o.  Admit date: 08/16/2011 Discharge date: 08/19/2011  Primary Care Physician:  Dr. 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      carvedilol 12.5 MG tablet   Commonly known as: COREG   Take 2 tablets (25 mg total) by mouth 2 (two) times daily with a meal.      docusate-casanthranol 100-30 MG per capsule   Commonly known as: PERICOLACE   Take 3 capsules by mouth daily.      famotidine 10 MG chewable tablet   Commonly known as: PEPCID AC   Chew 2 tablets (20 mg total) by mouth 2 (two) times daily.      furosemide 20 MG tablet   Commonly known as: LASIX   Take 1 tablet (20 mg total) by mouth daily.      lisinopril 40 MG tablet   Commonly known as: PRINIVIL,ZESTRIL   Take 1 tablet (40 mg total) by mouth daily.      magnesium oxide 400 MG tablet   Commonly known as: MAG-OX   Take 400 mg by mouth at bedtime.      multivitamin with minerals tablet   Take 1 tablet by mouth daily.             Disposition and Follow-up:  Discharge in stable condition w/o any chest pain, N/V or SOB. Patient also denies any Ha and/or blurred vision. Patient antihypertensive drugs has been adjusted and patient advised to follow a low sodium diet and to lose weight. She will establish care and follow as an  outpatient for further medication adjustments with Dr. Clent Ridges. During her visit will be important to check BMET to follow electrolytes and also kidney function. Down the road she will benefit of outpatient stress test.  Consults:   None   Significant Diagnostic Studies:  Dg Chest Portable 1 View  08/16/2011  *RADIOLOGY REPORT*  Clinical Data: Shortness of breath.  PORTABLE CHEST - 1 VIEW  Comparison: 08/14/2011  Findings: Single view of the chest was obtained.  There is haziness in the lower lungs probably related to technique and soft tissues. There is no focal airspace disease.  Vascular structures are similar to the prior examination.  Heart size is stable and upper limits of normal.  No evidence for pneumothorax.  IMPRESSION: Haziness in the lower lungs is probably related to overlying soft tissues rather than a pulmonary process.  Minimal change from the prior examination and no focal disease.  Original Report Authenticated By: Richarda Overlie, M.D.    Brief H and P: 66 y/o female with PMH of arthritis, HTN and HLD admitted 2/2 to chest discomfort and accelerated HTN. Patient reports some chest pain  and SOB; and the day of admission also some HA's. BP while in ED was 210/105. Patient was admitted for further evaluation and treatment.   Hospital Course:  1-Chest pain non cardiac (most likely GERD): negative Ce'z, no telemetry abnormalities and no wall motion abnormalities on 2-D echo. Patient symptoms improved with the use of PPI and having her BP controlled. Patient medication adjusted and appointment over the next 2 weeks with outpatient physician will be made for further evaluation and treatment.  2-Accelerated Hypertension: controlled at discharge and w/o any further complaints of HA, blurred vision or CP.   3-Arthritis: patient instructed to use tylenol for pain control and to lose weight.  4-Hyperlipidemia: advised to follow a low fat diet.  5-Hyperglycemia (A1C 6.1): not needing  medication yet; she will lose weight and follow a low carb diet; further check up on CBG's and A1C per PCP.  6-Chronic constipation: continue current bowel regimen and good hydration.  7-GERD: will continue omeprazole  8-Hx of asthma: stable, no wheezing. Currently w/o medications.  Time spent on Discharge: 40 minutes  Signed: Shant Hence 08/19/2011, 1:26 PM

## 2011-08-19 NOTE — Progress Notes (Signed)
PT NEEDS NEW APPT WITH DR. Clent Ridges.  I CONTACTED HER OFFICE AND I LEFT A MESSAGE FOR THE SCHEDULING COORDINATOR.  I HAVE SPOKE WITH THE PT AND TOLD HER THAT I WOULD F/U IF I DONT HEAR FROM THEM BEFORE THE CLOSE OF THE BUSINESS DAY.   Willa Rough 08/19/2011 [phone] OR [phone]

## 2011-08-19 NOTE — Progress Notes (Signed)
Pt complained of feeling constipated, pt said she does have a hx of constipation and that previously ordered stool softeners were not helping her to move her bowels. Pt was requesting a suppository, Md on call notified, orders received.-----Nicole Abbott, D. rn

## 2011-08-19 NOTE — Telephone Encounter (Signed)
Pt states that she is returning are three missed calls. Pt states that she is just getting in from being at the hospital

## 2011-08-20 ENCOUNTER — Inpatient Hospital Stay (HOSPITAL_COMMUNITY)
Admission: EM | Admit: 2011-08-20 | Discharge: 2011-08-23 | DRG: 305 | Disposition: A | Discharge status: Home / Self Care | Payer: Medicare Other | Attending: Internal Medicine | Admitting: Internal Medicine

## 2011-08-20 ENCOUNTER — Encounter (HOSPITAL_COMMUNITY): Payer: Self-pay | Admitting: Emergency Medicine

## 2011-08-20 ENCOUNTER — Emergency Department (HOSPITAL_COMMUNITY): Payer: Medicare Other

## 2011-08-20 ENCOUNTER — Other Ambulatory Visit: Payer: Self-pay

## 2011-08-20 DIAGNOSIS — I10 Essential (primary) hypertension: Secondary | ICD-10-CM

## 2011-08-20 DIAGNOSIS — M129 Arthropathy, unspecified: Secondary | ICD-10-CM | POA: Diagnosis present

## 2011-08-20 DIAGNOSIS — G4489 Other headache syndrome: Secondary | ICD-10-CM

## 2011-08-20 DIAGNOSIS — I519 Heart disease, unspecified: Secondary | ICD-10-CM | POA: Diagnosis present

## 2011-08-20 DIAGNOSIS — E785 Hyperlipidemia, unspecified: Secondary | ICD-10-CM

## 2011-08-20 DIAGNOSIS — J45909 Unspecified asthma, uncomplicated: Secondary | ICD-10-CM | POA: Diagnosis present

## 2011-08-20 DIAGNOSIS — M1990 Unspecified osteoarthritis, unspecified site: Secondary | ICD-10-CM

## 2011-08-20 DIAGNOSIS — Z7982 Long term (current) use of aspirin: Secondary | ICD-10-CM

## 2011-08-20 DIAGNOSIS — R079 Chest pain, unspecified: Secondary | ICD-10-CM

## 2011-08-20 DIAGNOSIS — Z8249 Family history of ischemic heart disease and other diseases of the circulatory system: Secondary | ICD-10-CM

## 2011-08-20 DIAGNOSIS — R51 Headache: Secondary | ICD-10-CM | POA: Diagnosis present

## 2011-08-20 LAB — CARDIAC PANEL(CRET KIN+CKTOT+MB+TROPI)
CK, MB: 2.2 ng/mL (ref 0.3–4.0)
Relative Index: 2 (ref 0.0–2.5)
Troponin I: <0.3 ng/mL (ref <0.30)

## 2011-08-20 LAB — BASIC METABOLIC PANEL
BUN: 12 mg/dL (ref 6–23)
Chloride: 103 mEq/L (ref 96–112)
GFR calc Af Amer: >90 mL/min (ref >90)
Glucose, Bld: 100 mg/dL — ABNORMAL HIGH (ref 70–99)
Potassium: 3.5 mEq/L (ref 3.5–5.1)

## 2011-08-20 LAB — DIFFERENTIAL
Lymphs Abs: 2.2 10*3/uL (ref 0.7–4.0)
Monocytes Relative: 10 % (ref 3–12)
Neutro Abs: 1.9 10*3/uL (ref 1.7–7.7)
Neutrophils Relative %: 39 % — ABNORMAL LOW (ref 43–77)

## 2011-08-20 LAB — CBC
Hemoglobin: 12.3 g/dL (ref 12.0–15.0)
RBC: 4.56 MIL/uL (ref 3.87–5.11)
WBC: 4.9 10*3/uL (ref 4.0–10.5)

## 2011-08-20 MED ORDER — LABETALOL HCL 200 MG PO TABS
200.0000 mg | ORAL_TABLET | Freq: Two times a day (BID) | ORAL | Status: DC
  Administered 2011-08-21 – 2011-08-22 (×3): 200 mg via ORAL
  Filled 2011-08-20 (×5): qty 1

## 2011-08-20 MED ORDER — ASPIRIN 81 MG PO CHEW
81.0000 mg | CHEWABLE_TABLET | Freq: Every day | ORAL | Status: DC
  Administered 2011-08-21 – 2011-08-23 (×3): 81 mg via ORAL
  Filled 2011-08-20 (×3): qty 1

## 2011-08-20 MED ORDER — FAMOTIDINE 20 MG PO TABS
20.0000 mg | ORAL_TABLET | Freq: Every day | ORAL | Status: DC
  Administered 2011-08-21 – 2011-08-23 (×3): 20 mg via ORAL
  Filled 2011-08-20 (×3): qty 1

## 2011-08-20 MED ORDER — NICARDIPINE HCL IN NACL 20-0.86 MG/200ML-% IV SOLN
5.0000 mg/h | INTRAVENOUS | Status: DC
  Administered 2011-08-21: 0.5 mg/h via INTRAVENOUS
  Administered 2011-08-21: 1 mg/h via INTRAVENOUS
  Filled 2011-08-20 (×3): qty 200

## 2011-08-20 MED ORDER — NICARDIPINE HCL IN NACL 20-0.86 MG/200ML-% IV SOLN
5.0000 mg/h | Freq: Once | INTRAVENOUS | Status: AC
  Administered 2011-08-20: 5 mg/h via INTRAVENOUS
  Filled 2011-08-20: qty 200

## 2011-08-20 MED ORDER — LISINOPRIL 40 MG PO TABS
40.0000 mg | ORAL_TABLET | Freq: Every day | ORAL | Status: DC
  Administered 2011-08-21 – 2011-08-23 (×3): 40 mg via ORAL
  Filled 2011-08-20 (×3): qty 1

## 2011-08-20 NOTE — ED Notes (Signed)
Called pharmacy to send labetalol.

## 2011-08-20 NOTE — Telephone Encounter (Signed)
Call 911 and go to the ER for evaluation and treatment.  Barbar Brede

## 2011-08-20 NOTE — H&P (Signed)
Name: Nicole Abbott MRN: [id number] DOB: Aug 20, 1945    LOS: 0  PCCM ADMISSION NOTE  History of Present Illness: 66 yo AAF with hypertension brought to [facility] ED with headache, dyspnea,and chest pressure, radiating to left arm and jaw.  Found to be hypertensive, with SBP>200.  Discharged yesterday (was admitted with similar picture.  Lines / Drains: None  Cultures: None  Antibiotics: None  Tests / Events:   Past Medical History  Diagnosis Date  . Hypertension   . Asthma   . Arthritis    Past Surgical History  Procedure Date  . Tubal ligation    Prior to Admission medications   Medication Sig Start Date End Date Taking? Authorizing Provider  aspirin 81 MG tablet Take 81 mg by mouth every morning. 08/19/11 08/18/12 Yes Vassie Loll, MD  carvedilol (COREG) 12.5 MG tablet Take 25 mg by mouth 2 (two) times daily with a meal. 08/19/11 08/18/12 Yes Vassie Loll, MD  docusate-casanthranol (PERICOLACE) 100-30 MG per capsule Take 3 capsules by mouth daily.    Yes Historical Provider, MD  famotidine (PEPCID AC) 10 MG chewable tablet Chew 20 mg by mouth 2 (two) times daily. 08/19/11  Yes Vassie Loll, MD  furosemide (LASIX) 20 MG tablet Take 20 mg by mouth daily. 08/19/11  Yes Vassie Loll, MD  lisinopril (PRINIVIL,ZESTRIL) 40 MG tablet Take 40 mg by mouth every morning. 08/19/11 08/18/12 Yes Vassie Loll, MD  magnesium oxide (MAG-OX) 400 MG tablet Take 400 mg by mouth at bedtime.   Yes Historical Provider, MD  Multiple Vitamins-Minerals (MULTIVITAMIN WITH MINERALS) tablet Take 1 tablet by mouth daily.   Yes Historical Provider, MD   Allergies Allergies  Allergen Reactions  . Penicillins Anaphylaxis   Family History Family History  Problem Relation Age of Onset  . Diabetes Mother   . Hypertension Mother   . Diabetes Sister   . Hypertension Sister    Social History  reports that she quit smoking about 24 years ago. She does not have any smokeless tobacco history on file. She  reports that she drinks alcohol. She reports that she does not use illicit drugs.  Review Of Systems  11 points negative except as in HPI  Vital Signs: Temp:  [98 F (36.7 C)-98.2 F (36.8 C)] 98.2 F (36.8 C) (02/20 1856) Pulse Rate:  [57-71] 71  (02/20 2230) Resp:  [11-21] 18  (02/20 2219) BP: (153-229)/(53-85) 179/55 mmHg (02/20 2230) SpO2:  [92 %-100 %] 92 % (02/20 2230)   Physical Examination: General:  Comfortable, non acute distress Neuro:  Awake, alert, cooperative HEENT:  PERRL, pink conjunctivae, moist membranes Neck:  Supple, no JVD   Cardiovascular:  RRR, no M/R/G Lungs:  Bilateral diminished air entry, no W/R/R Abdomen:  Obese, soft, nontender, nondistended, bowel sounds present Musculoskeletal:  Moves all extremities, no pedal edema Skin:  No rash  Ventilator settings:   Labs and Imaging:  Reviewed.  Please refer to the Assessment and Plan section for relevant results.  ASSESSMENT AND PLAN  NEUROLOGIC A:  Headache secondary to hypertensive emergency.  Now resolved. P: -->  Observe  PULMONARY No results found for this basename: PHART:5,PCO2:5,PCO2ART:5,PO2ART:5,HCO3:5,O2SAT:5 in the last 168 hours A:  History of asthma.  No acute bronchospasm. P: -->  No interventions required  CARDIOVASCULAR  Lab 08/20/11 2003 08/17/11 1620 08/17/11 0845 08/17/11 0037  TROPONINI <0.30 <0.30 <0.30 <0.30  LATICACIDVEN -- -- -- --  PROBNP -- -- -- --   A:  Hypertensive emergency.  History of hypertension.  P: -->  Cardene gtt (goal 25% reduction in SBP) -->  Start Lisinopril, Labetalol, ASA -->  12-lead EKG -->  Cardiac enzymes  RENAL  Lab 08/20/11 2001 08/19/11 0630 08/18/11 0530 08/17/11 0607 08/17/11 0037 08/16/11 2203  NA 139 140 141 141 -- 142  K 3.5 3.7 -- -- -- --  CL 103 105 105 104 -- 104  CO2 27 27 27 28  -- 30  BUN 12 13 13 11  -- 11  CREATININE 0.75 0.71 0.78 0.69 0.72 --  CALCIUM 9.7 9.3 9.2 9.0 -- 9.3  MG -- -- -- -- -- --  PHOS -- -- -- -- --  --   A:  No active issues P: -->  No interventions required  GASTROINTESTINAL  Lab 08/16/11 2203 08/14/11 1658  AST 29 30  ALT 23 23  ALKPHOS 81 89  BILITOT 0.1* 0.3  PROT 7.1 7.3  ALBUMIN 3.5 4.2   A:  No active issues P: -->  Pepcid as preadmission  HEMATOLOGIC  Lab 08/20/11 2001 08/18/11 0530 08/17/11 0607 08/17/11 0037 08/16/11 2203  HGB 12.3 12.0 11.9* 11.8* 12.2  HCT 36.6 35.7* 35.7* 35.5* 36.4  PLT 308 295 291 297 305  INR -- -- -- -- --  APTT -- -- -- -- --   A:  No active issues P: -->  No intervention required  INFECTIOUS  Lab 08/20/11 2001 08/18/11 0530 08/17/11 0607 08/17/11 0037 08/16/11 2203  WBC 4.9 4.8 4.5 5.1 5.0  PROCALCITON -- -- -- -- --   A:  No active issues P: -->  No intervention required  ENDOCRINE No results found for this basename: GLUCAP:5 in the last 168 hours A:  No active issues P: -->  No interventions required  BEST PRACTICE / DISPOSITION -->  ICU status under PCCM -->  Full code -->  Low sodium -->  SCDs for DVT Px -->  GI Px is not indicated (Pepcid preadmission)  Orlean Bradford, M.D. Pulmonary and Critical Care Medicine [facility] Cell: [phone] Pager: [phone]  08/20/2011, 10:57 PM

## 2011-08-20 NOTE — Telephone Encounter (Signed)
Gave Nicole Abbott instructions from [place], and she agreed to go to ER. Dr Georgiana Shore, see message for your info.

## 2011-08-20 NOTE — ED Notes (Signed)
Pt reports being seen in hospital few days ago.  Pt states mild chest tightness and htn at this time.

## 2011-08-20 NOTE — ED Notes (Signed)
Pt here with hypertensive crisis; pt sts discharged yesterday for same; pt c/o dizziness and SOB

## 2011-08-20 NOTE — Telephone Encounter (Signed)
Pt called back to report she got out of the hosp last night and now her BPs are back up. Taken manually at 3:30 today 210/92, and automatic 223/96. She had been in hosp since we sent her last week at [facility] and has been on hydralazine IV push in hosp. At DC her BP was 170/80s manually. They put her on Lasix 20 QD,Coreg 25 BID, and Lisinopril 40 QD. Please advise whether she needs to go back to ER

## 2011-08-20 NOTE — ED Provider Notes (Signed)
History     CSN: [id number]  Arrival date & time 08/20/11  1707   First MD Initiated Contact with Patient 08/20/11 1901      Chief Complaint  Patient presents with  . Hypertension    (Consider location/radiation/quality/duration/timing/severity/associated sxs/prior treatment) HPI Pt d/c yesterday for same complaint. Represents with hypertension with SBP >200, chest pressure and HA. Pt denies SOB, weakness, numbness. CP is substernal and does not radiate.  Past Medical History  Diagnosis Date  . Hypertension   . Asthma   . Arthritis     Past Surgical History  Procedure Date  . Tubal ligation     Family History  Problem Relation Age of Onset  . Diabetes Mother   . Hypertension Mother   . Diabetes Sister   . Hypertension Sister     History  Substance Use Topics  . Smoking status: Former Smoker    Quit date: 07/01/1987  . Smokeless tobacco: Not on file  . Alcohol Use: Yes     Occasional wine drinker    OB History    Grav Para Term Preterm Abortions TAB SAB Ect Mult Living                  Review of Systems  Constitutional: Negative for fever and chills.  HENT: Negative for neck pain.   Eyes: Negative for visual disturbance.  Respiratory: Positive for chest tightness. Negative for shortness of breath.   Cardiovascular: Positive for chest pain. Negative for palpitations and leg swelling.  Gastrointestinal: Negative for nausea, vomiting, abdominal pain and constipation.  Genitourinary: Negative for dysuria and flank pain.  Musculoskeletal: Negative for back pain.  Skin: Negative for color change, pallor and rash.  Neurological: Positive for headaches. Negative for dizziness, syncope, weakness, light-headedness and numbness.    Allergies  Penicillins  Home Medications   Current Outpatient Rx  Name Route Sig Dispense Refill  . ASPIRIN 81 MG PO TABS Oral Take 81 mg by mouth every morning.    Marland Kitchen CARVEDILOL 12.5 MG PO TABS Oral Take 25 mg by mouth 2 (two)  times daily with a meal.    . DOCUSATE SODIUM-CASANTHRANOL 100-30 MG PO CAPS Oral Take 3 capsules by mouth daily.     Marland Kitchen FAMOTIDINE 10 MG PO CHEW Oral Chew 20 mg by mouth 2 (two) times daily.    . FUROSEMIDE 20 MG PO TABS Oral Take 20 mg by mouth daily.    Marland Kitchen LISINOPRIL 40 MG PO TABS Oral Take 40 mg by mouth every morning.    Marland Kitchen MAGNESIUM OXIDE 400 MG PO TABS Oral Take 400 mg by mouth at bedtime.    . MULTI-VITAMIN/MINERALS PO TABS Oral Take 1 tablet by mouth daily.      BP 207/83  Pulse 62  Temp(Src) 98.2 F (36.8 C) (Oral)  Resp 18  SpO2 99%  Physical Exam  Nursing note and vitals reviewed. Constitutional: She is oriented to person, place, and time. She appears well-developed and well-nourished. No distress.  HENT:  Head: Normocephalic and atraumatic.  Mouth/Throat: Oropharynx is clear and moist.  Eyes: EOM are normal. Pupils are equal, round, and reactive to light.  Neck: Normal range of motion. Neck supple.  Cardiovascular: Normal rate and regular rhythm.   Pulmonary/Chest: Effort normal and breath sounds normal. No respiratory distress. She has no wheezes. She has no rales.  Abdominal: Soft. Bowel sounds are normal. There is no tenderness. There is no rebound and no guarding.  Musculoskeletal: Normal range of motion.  She exhibits no edema and no tenderness.  Neurological: She is alert and oriented to person, place, and time.       5/5 motor, sensation intact  Skin: Skin is warm and dry. No rash noted. No erythema.  Psychiatric: She has a normal mood and affect. Her behavior is normal.    ED Course  Procedures (including critical care time)   Labs Reviewed  CBC  DIFFERENTIAL  BASIC METABOLIC PANEL  CARDIAC PANEL(CRET KIN+CKTOT+MB+TROPI)   Dg Chest Port 1 View  08/20/2011  *RADIOLOGY REPORT*  Clinical Data: Chest pain  PORTABLE CHEST - 1 VIEW  Comparison: 08/16/2011  Findings: Artifact overlies chest. Left ventricular prominence again noted.  There are chronic markings at  the lung bases but no sign of active infiltrate, mass, effusion or collapse.  Vascularity is within normal limits.  No significant bony finding.  IMPRESSION: No active disease.  Left ventricular prominence.  Chronic markings at the bases.  Original Report Authenticated By: Thomasenia Sales, M.D.     No diagnosis found.   Date: 08/20/2011  Rate: 55  Rhythm: sinus bradycardia  QRS Axis: normal  Intervals: normal  ST/T Wave abnormalities: normal  Conduction Disutrbances:none  Narrative Interpretation:   Old EKG Reviewed: unchanged    MDM  Discussed with [facility]. Stated if pt on Cardene then haas to be admitted to ICU. Intensive care to eval in ED        Loren Racer, MD 08/21/11 [phone]

## 2011-08-21 DIAGNOSIS — G4489 Other headache syndrome: Secondary | ICD-10-CM

## 2011-08-21 DIAGNOSIS — I10 Essential (primary) hypertension: Secondary | ICD-10-CM

## 2011-08-21 DIAGNOSIS — R079 Chest pain, unspecified: Secondary | ICD-10-CM

## 2011-08-21 LAB — CARDIAC PANEL(CRET KIN+CKTOT+MB+TROPI)
CK, MB: 2.2 ng/mL (ref 0.3–4.0)
Relative Index: 2.3 (ref 0.0–2.5)
Relative Index: INVALID (ref 0.0–2.5)
Total CK: 88 U/L (ref 7–177)
Troponin I: <0.3 ng/mL (ref <0.30)
Troponin I: <0.3 ng/mL (ref <0.30)

## 2011-08-21 MED ORDER — POTASSIUM CHLORIDE CRYS ER 20 MEQ PO TBCR
40.0000 meq | EXTENDED_RELEASE_TABLET | Freq: Once | ORAL | Status: AC
  Administered 2011-08-21: 40 meq via ORAL
  Filled 2011-08-21: qty 2

## 2011-08-21 MED ORDER — LABETALOL HCL 100 MG PO TABS
100.0000 mg | ORAL_TABLET | Freq: Once | ORAL | Status: AC
  Administered 2011-08-21: 100 mg via ORAL
  Filled 2011-08-21: qty 1

## 2011-08-21 MED ORDER — LABETALOL HCL 200 MG PO TABS
200.0000 mg | ORAL_TABLET | Freq: Once | ORAL | Status: DC
  Filled 2011-08-21: qty 1

## 2011-08-21 MED ORDER — HYDROCHLOROTHIAZIDE 25 MG PO TABS
25.0000 mg | ORAL_TABLET | Freq: Every day | ORAL | Status: DC
  Administered 2011-08-22: 25 mg via ORAL
  Filled 2011-08-21 (×3): qty 1

## 2011-08-21 MED ORDER — SODIUM CHLORIDE 0.9 % IV SOLN
INTRAVENOUS | Status: DC
  Administered 2011-08-21: 10 mL/h via INTRAVENOUS

## 2011-08-21 MED ORDER — HEPARIN SODIUM (PORCINE) 5000 UNIT/ML IJ SOLN
5000.0000 [IU] | Freq: Three times a day (TID) | INTRAMUSCULAR | Status: DC
  Administered 2011-08-21: 5000 [IU] via SUBCUTANEOUS
  Filled 2011-08-21 (×4): qty 1

## 2011-08-21 NOTE — Clinical Documentation Improvement (Signed)
Hypertension Documentation Clarification Query  THIS DOCUMENT IS NOT A PERMANENT PART OF THE MEDICAL RECORD  TO RESPOND TO THE THIS QUERY, FOLLOW THE INSTRUCTIONS BELOW:  1. If needed, update documentation for the patient's encounter via the notes activity.  2. Access this query again and click edit on the In [employer].  3. After updating, or not, click F2 to complete all highlighted (required) fields concerning your review. Select "additional documentation in the medical record" OR "no additional documentation provided".  4. Click Sign note button.  5. The deficiency will fall out of your In Basket *Please let us know if you are not able to complete this workflow by phone or e-mail (listed below).  Please update documentation to reflect response to this query in a progress note/discharge summary.         08/21/11  Dear Dr. Shauna Hugh Marton Redwood  In an effort to better capture your patient's severity of illness, reflect appropriate length of stay and utilization of resources, a review of the patient medical record has revealed the following indicators. Based on your clinical judgment, please clarify and document in a progress note and/or discharge summary the clinical condition associated with the following supporting information: In responding to this query please exercise your independent judgment.  The fact that a query is asked, does not imply that any particular answer is desired or expected.  Possible Clinical Conditions?   " Accelerated Hypertension  " Malignant Hypertension  " Or Other Condition __________________________  " Cannot Clinically Determine   Supporting Information:  Risk Factors:previous admit with accelerated hypertension diagnosis, Former tobacco use. Dyslipidemia.  Signs and Symptoms: SBP/DBP range:229/85; 207/83; 215/67; 188/69; 152/69  Diagnostics: Troponin level: <.30 CPK: 88 CPKMB:2.2 Echo: grade 2 diastolic dysfunction  Radiology:  No active disease. Left ventricular prominence. Chronic markings at the bases  Treatment:labatolol, lisinopril, hydrodiuril  Drips/IV meds: cardene  You may use possible, probable, or suspect with inpatient documentation. Possible, probable, suspected diagnoses MUST be documented at the time of discharge.  Reviewed:  no additional documentation provided  Thank You,  Amada Kingfisher   Clinical Documentation Specialist: RN, BSN, CCM  [phone] [email or web address]  Health Information Management [place]

## 2011-08-21 NOTE — ED Notes (Signed)
Pt bp is very sensitive to Nicardapine drip.  Her sbp increased to 188 when the drip was originally decreased to 2 mg/hr.  The drip was increased to 3 mg/hr and her sbp dropped to 156.  Drip was decreased to 2 mg/hr and sbp dropped to 128.  Dr Burna Forts practice was paged RE whether or not to give labalol.  Spoke with Dr Karen Kays Dedering and she stated to to decrease labetalol to 100 mg oral and continue titrating bp.

## 2011-08-21 NOTE — Progress Notes (Signed)
UR Completed.  Nicole Abbott Jane [phone] 08/21/2011  

## 2011-08-21 NOTE — Progress Notes (Signed)
Name: Nicole Abbott MRN: [id number] DOB: 18-Sep-1945    LOS: 1  PCCM ADMISSION NOTE  History of Present Illness: 66 yo AAF with hypertension brought to [facility] ED with headache, dyspnea,and chest pressure, radiating to left arm and jaw.  Found to be hypertensive, with SBP>200.  Discharged yesterday (was admitted with similar picture).  Lines / Drains: None  Cultures: None  Antibiotics: None      Interval: No new complaints   Vital Signs: Temp:  [98 F (36.7 C)-98.7 F (37.1 C)] 98.3 F (36.8 C) (02/21 1628) Pulse Rate:  [56-77] 59  (02/21 1628) Resp:  [11-23] 14  (02/21 1628) BP: (121-229)/(41-117) 159/53 mmHg (02/21 1628) SpO2:  [89 %-100 %] 98 % (02/21 1628) Weight:  [108.2 kg (238 lb 8.6 oz)] 108.2 kg (238 lb 8.6 oz) (02/21 0150) I/O last 3 completed shifts: In: 348.7 [P.O.:240; I.V.:108.7] Out: -  Physical Examination: General:  Comfortable, non acute distress Neuro:  Awake, alert, cooperative HEENT:  PERRL, pink conjunctivae, moist membranes Neck:  Supple, no JVD   Cardiovascular:  RRR, no M/R/G Lungs:  Bilateral diminished air entry, no W/R/R Abdomen:  Obese, soft, nontender, nondistended, bowel sounds present Musculoskeletal:  Moves all extremities, no pedal edema Skin:  No rash   ASSESSMENT AND PLAN  NEUROLOGIC A:  Headache secondary to hypertensive emergency.  Now resolved. P: -->  Observe   CARDIOVASCULAR  Lab 08/21/11 0547 08/20/11 2323 08/20/11 2003 08/17/11 1620 08/17/11 0845  TROPONINI <0.30 <0.30 <0.30 <0.30 <0.30  LATICACIDVEN -- -- -- -- --  PROBNP -- -- -- -- --   A:  Hypertensive emergency.  History of hypertension. P: Controlled adequately and no longer requiring nicardipine gtt. Transfer to regular bed. Have discussed with Dr Butler Denmark. TRH will resume her care in AM 2/22 and PCCM will sign off @ that time    Billy Fischer, MD;  PCCM service; Mobile [phone]

## 2011-08-21 NOTE — Progress Notes (Signed)
eLink Physician-Brief Progress Note Patient Name: Nicole Abbott DOB: 08-22-1945 MRN: [id number]  Date of Service  08/21/2011   HPI/Events of Note  Best Practice   eICU Interventions  Subq heparin ordered for DVT propy   Intervention Category Intermediate Interventions: Best-practice therapies (e.g. DVT, beta blocker, etc.)  Taja Pentland 08/21/2011, 5:58 AM

## 2011-08-22 LAB — BASIC METABOLIC PANEL
BUN: 14 mg/dL (ref 6–23)
Calcium: 9.5 mg/dL (ref 8.4–10.5)
GFR calc Af Amer: >90 mL/min (ref >90)
GFR calc non Af Amer: 85 mL/min — ABNORMAL LOW (ref >90)
Glucose, Bld: 126 mg/dL — ABNORMAL HIGH (ref 70–99)
Potassium: 4.2 mEq/L (ref 3.5–5.1)

## 2011-08-22 MED ORDER — LABETALOL HCL 300 MG PO TABS
300.0000 mg | ORAL_TABLET | Freq: Two times a day (BID) | ORAL | Status: DC
  Administered 2011-08-22: 300 mg via ORAL
  Filled 2011-08-22 (×3): qty 1

## 2011-08-22 MED ORDER — LABETALOL HCL 100 MG PO TABS
100.0000 mg | ORAL_TABLET | Freq: Once | ORAL | Status: AC
  Administered 2011-08-22: 100 mg via ORAL
  Filled 2011-08-22: qty 1

## 2011-08-22 NOTE — Progress Notes (Signed)
Subjective:  Patient denies chest pain, also denies headaches. Objective: Vital signs in last 24 hours: Temp:  [98.1 F (36.7 C)-98.5 F (36.9 C)] 98.5 F (36.9 C) (02/22 1400) Pulse Rate:  [58-74] 65  (02/22 1400) Resp:  [18-20] 18  (02/22 1400) BP: (149-191)/(67-82) 178/72 mmHg (02/22 1400) SpO2:  [95 %-99 %] 96 % (02/22 1400) Last BM Date: 08/21/11 Intake/Output from previous day: 02/21 0701 - 02/22 0700 In: 760 [P.O.:720; I.V.:40] Out: -  Intake/Output this shift: Total I/O In: 840 [P.O.:840] Out: -     General Appearance:    Alert and oriented x3, cooperative, no distress, appears stated age  Lungs:     Clear to auscultation bilaterally, respirations unlabored   Heart:    Regular rate and rhythm, S1 and S2 normal, no murmur, rub   or gallop  Abdomen:     Soft, non-tender, bowel sounds active all four quadrants,    no masses, no organomegaly  Extremities:   Extremities normal, atraumatic, no cyanosis or edema  Neurologic:   CNII-XII intact, normal strength, sensation and reflexes    throughout    Weight change:   Intake/Output Summary (Last 24 hours) at 08/22/11 1834 Last data filed at 08/22/11 1300  Gross per 24 hour  Intake    840 ml  Output      0 ml  Net    840 ml    Lab Results:   Basename 08/22/11 0625 08/20/11 2001  NA 139 139  K 4.2 3.5  CL 104 103  CO2 24 27  GLUCOSE 126* 100*  BUN 14 12  CREATININE 0.77 0.75  CALCIUM 9.5 9.7    Basename 08/20/11 2001  WBC 4.9  HGB 12.3  HCT 36.6  PLT 308  MCV 80.3   PT/INR No results found for this basename: LABPROT:2,INR:2 in the last 72 hours ABG No results found for this basename: PHART:2,PCO2:2,PO2:2,HCO3:2 in the last 72 hours  Micro Results: Recent Results (from the past 240 hour(s))  MRSA PCR SCREENING     Status: Normal   Collection Time   08/21/11  1:52 AM      Component Value Range Status Comment   MRSA by PCR NEGATIVE  NEGATIVE  Final    Studies/Results: Dg Chest Port 1  View  08/20/2011  *RADIOLOGY REPORT*  Clinical Data: Chest pain  PORTABLE CHEST - 1 VIEW  Comparison: 08/16/2011  Findings: Artifact overlies chest. Left ventricular prominence again noted.  There are chronic markings at the lung bases but no sign of active infiltrate, mass, effusion or collapse.  Vascularity is within normal limits.  No significant bony finding.  IMPRESSION: No active disease.  Left ventricular prominence.  Chronic markings at the bases.  Original Report Authenticated By: Thomasenia Sales, M.D.   Medications: Scheduled Meds:   . aspirin  81 mg Oral Daily  . famotidine  20 mg Oral Daily  . hydrochlorothiazide  25 mg Oral Daily  . labetalol  100 mg Oral Once  . labetalol  300 mg Oral BID  . lisinopril  40 mg Oral Daily  . DISCONTD: labetalol  200 mg Oral BID   Continuous Infusions:   . DISCONTD: sodium chloride Stopped (08/21/11 1200)   PRN Meds:. Assessment/Plan:  1.Malignant hypertension -Pressure control improved overnight, but blood pressures this afternoon trended up to systolic pressures in the 180s. -labetalol dose now increased to 300 twice a day, will monitor and if blood pressures stay better controlled on we'll plan DC tomorrow -Long  discussion with the patient educating on meds, and she agreed to take her which she had refused on 2/21.  LOS: 2 days   Hadlyn Amero C 08/22/2011, 6:34 PM

## 2011-08-23 MED ORDER — HYDROCHLOROTHIAZIDE 12.5 MG PO CAPS
12.5000 mg | ORAL_CAPSULE | Freq: Once | ORAL | Status: AC
  Administered 2011-08-23: 12.5 mg via ORAL
  Filled 2011-08-23: qty 1

## 2011-08-23 MED ORDER — LABETALOL HCL 200 MG PO TABS
200.0000 mg | ORAL_TABLET | Freq: Three times a day (TID) | ORAL | Status: DC
  Administered 2011-08-23: 200 mg via ORAL
  Filled 2011-08-23 (×2): qty 1

## 2011-08-23 MED ORDER — HYDROCHLOROTHIAZIDE 25 MG PO TABS: 12.5000 mg | ORAL_TABLET | Freq: Every day | ORAL | Status: DC

## 2011-08-23 MED ORDER — LABETALOL HCL 200 MG PO TABS: 200.0000 mg | ORAL_TABLET | Freq: Three times a day (TID) | ORAL | Status: DC

## 2011-08-23 MED ORDER — LABETALOL HCL 200 MG PO TABS
200.0000 mg | ORAL_TABLET | Freq: Once | ORAL | Status: AC
  Filled 2011-08-23: qty 1

## 2011-08-23 NOTE — Discharge Summary (Signed)
Discharge Note  Name: Nicole Abbott MRN: [id number] DOB: [birth year] 66 y.o.  Date of Admission: 08/20/2011  5:14 PM Date of Discharge: 08/23/2011 Attending Physician: Kela Millin, MD  Discharge Diagnosis:  Malignant hypertension/hypertensive emergency Diastolic dysfunction-per 2-D echo of 2/18 Arthritis  Discharge Medications: Medication List  As of 08/23/2011  1:51 PM   STOP taking these medications         carvedilol 12.5 MG tablet      furosemide 20 MG tablet         TAKE these medications         aspirin 81 MG tablet   Take 81 mg by mouth every morning.      docusate-casanthranol 100-30 MG per capsule   Commonly known as: PERICOLACE   Take 3 capsules by mouth daily.      famotidine 10 MG chewable tablet   Commonly known as: PEPCID AC   Chew 20 mg by mouth 2 (two) times daily.      hydrochlorothiazide 25 MG tablet   Commonly known as: HYDRODIURIL   Take 0.5-1 tablets (12.5-25 mg total) by mouth daily.      labetalol 200 MG tablet   Commonly known as: NORMODYNE   Take 1 tablet (200 mg total) by mouth 3 (three) times daily.      lisinopril 40 MG tablet   Commonly known as: PRINIVIL,ZESTRIL   Take 40 mg by mouth every morning.      magnesium oxide 400 MG tablet   Commonly known as: MAG-OX   Take 400 mg by mouth at bedtime.      multivitamin with minerals tablet   Take 1 tablet by mouth daily.            Disposition and follow-up:   Ms.Nicole Abbott was discharged from [facility] in improved/stable condition.    Follow-up Appointments: Discharge Orders    Future Orders Please Complete By Expires   Diet - low sodium heart healthy      Increase activity slowly         Consultations:    Procedures Performed:   08/01/2011  *RADIOLOGY REPORT*  Clinical Data: Right back and flank pain  LUMBAR SPINE - COMPLETE 4+ VIEW  Comparison: None.  Findings: Five lumbar-type vertebral bodies show normal alignment in the frontal  projection.  There is disc space narrowing at L4-5 and L5-S1.  There is facet arthropathy at L2-3, L3-4, L4-5 and L5- S1 with 5 mm of anterolisthesis at L4-5 due to this facet arthropathy.  No evidence of fracture or other focal lesion.  IMPRESSION: Lower lumbar facet arthropathy, most pronounced at L4-5 where there is anterolisthesis of 5 mm.  Disc space narrowing at L4-5 and L5- S1.  These findings could be symptomatic.  Original Report Authenticated By: Thomasenia Sales, M.D.   Dg Chest Port 1 View  08/20/2011  *RADIOLOGY REPORT*  Clinical Data: Chest pain  PORTABLE CHEST - 1 VIEW  Comparison: 08/16/2011  Findings: Artifact overlies chest. Left ventricular prominence again noted.  There are chronic markings at the lung bases but no sign of active infiltrate, mass, effusion or collapse.  Vascularity is within normal limits.  No significant bony finding.  IMPRESSION: No active disease.  Left ventricular prominence.  Chronic markings at the bases.  Original Report Authenticated By: Thomasenia Sales, M.D.    Admission HPI The patient is a 66 year old female with hypertension who presented with headaches dyspnea and chest pressure radiating  to left arm and jaw. In the ED she was found to be hypertensive with systolic blood pressure greater than 200. It was noted that she had just been discharged the day prior following  a similar presentation. She was admitted to the ICU on the critical care service for further evaluation and management.  Physical exam General: Comfortable, non acute distress  Neuro: Awake, alert, cooperative  HEENT: PERRL, pink conjunctivae, moist membranes  Neck: Supple, no JVD  Cardiovascular: RRR, no M/R/G  Lungs: Bilateral diminished air entry, no W/R/R  Abdomen: Obese, soft, nontender, nondistended, bowel sounds present  Musculoskeletal: Moves all extremities, no pedal edema  Skin: No rash  Hospital Course by problem list: Active Problems:  #1 Malignant  hypertension/hypertensive emergency As discussed above the patient found to have systolic blood pressure greater than 200 and was admitted to the intensive care unit on the critical care service. She was treated with a Cardene trip, with the goal of the decreasing her systolic blood pressure by 25% in the first 24 hours. When her blood pressures were better controlled she was transitioned to oral medications-labetalol, lisinopril and HCTZ, and she was transferred to the telemetry floor and to the hospitalist service. Her blood pressures initially did well on this oral regimen, but On followup the patient's blood pressures have trended up to the 180s systolic, and so her labetalol dose was increased and she was further monitored. In followup today patient's blood pressures have been better controlled. She is complaining of cramping in her legs a lot last potassium was noted to be 4.2, because of this she prefers to be able to a alternate 12.5 and 25 mg doses of her HCTZ based on how her blood pressures are doing since she monitors them regularly at home (even though KCl when needed was recommended). she is to followup with her PCP for further monitoring of her blood pressures and adjustment of her medications as clinically appropriate for optimal blood pressure control. She is clinically improved-asymptomatic at this time. #2 Diastolic dysfunction-per 2-D echo of 2/18. She had no clinical evidence of fluid overload.   Discharge Vitals:  BP 147/68  Pulse 61  Temp(Src) 97.5 F (36.4 C) (Oral)  Resp 18  Ht 5\' 4"  (1.626 m)  Wt 108.2 kg (238 lb 8.6 oz)  BMI 40.94 kg/m2  SpO2 98%  Discharge Labs: No results found for this or any previous visit (from the past 24 hour(s)).  SignedKela Millin 08/23/2011, 1:51 PM

## 2011-08-23 NOTE — Discharge Summary (Signed)
SL removed from L hand, catheter tip intact; no bleeding noted.  Pt given d/c instructions along with f/u apt to be made with PCP and medication information.  Scripts given for HCTZ and labetolol.  Pt verbalized understanding of d/c instructions.  Pt d/c'd home via w/c accompanied by medical staff; all personal belongings sent with pt.

## 2011-09-04 ENCOUNTER — Ambulatory Visit (INDEPENDENT_AMBULATORY_CARE_PROVIDER_SITE_OTHER): Payer: Medicare Other | Admitting: Family Medicine

## 2011-09-04 VITALS — BP 178/98 | HR 90 | Temp 98.2°F | Resp 16 | Ht 64.75 in | Wt 241.0 lb

## 2011-09-04 DIAGNOSIS — I10 Essential (primary) hypertension: Secondary | ICD-10-CM

## 2011-09-04 DIAGNOSIS — R42 Dizziness and giddiness: Secondary | ICD-10-CM

## 2011-09-04 NOTE — Progress Notes (Signed)
66 yo woman who was discharged from hospital for hypertension on 23d of February.  No h/o kidney disease.  Has appointment with cardiologist on the 25th of March.  Needs a new PCP.   Feels dizzy all the time since having started these three new medicines at the same time.  In the hospital, she was on Lisinopril 40, Lasix 40, and Labetalol 300.  O: 170/88  P: 60 and regular Heart is regular, no murmur. 1-2+ pedal edema  A:  Difficult to control blood pressure.  P:  Check CMET Check magnesium

## 2011-09-05 LAB — BASIC METABOLIC PANEL
BUN: 10 mg/dL (ref 6–23)
CO2: 30 mEq/L (ref 19–32)
Calcium: 9.6 mg/dL (ref 8.4–10.5)
Chloride: 102 mEq/L (ref 96–112)
Creat: 0.91 mg/dL (ref 0.50–1.10)
Glucose, Bld: 84 mg/dL (ref 70–99)
Potassium: 4.2 mEq/L (ref 3.5–5.3)
Sodium: 142 mEq/L (ref 135–145)

## 2011-09-05 LAB — MAGNESIUM: Magnesium: 1.7 mg/dL (ref 1.5–2.5)

## 2011-09-09 ENCOUNTER — Encounter: Payer: Self-pay | Admitting: *Deleted

## 2011-09-12 ENCOUNTER — Ambulatory Visit: Payer: Medicare Other | Admitting: Family Medicine

## 2011-09-16 ENCOUNTER — Ambulatory Visit (INDEPENDENT_AMBULATORY_CARE_PROVIDER_SITE_OTHER): Payer: Medicare Other | Admitting: Family Medicine

## 2011-09-16 ENCOUNTER — Encounter: Payer: Self-pay | Admitting: Family Medicine

## 2011-09-16 VITALS — BP 188/75 | HR 57 | Temp 97.5°F | Resp 16 | Ht 63.75 in | Wt 242.0 lb

## 2011-09-16 DIAGNOSIS — R0609 Other forms of dyspnea: Secondary | ICD-10-CM

## 2011-09-16 DIAGNOSIS — E669 Obesity, unspecified: Secondary | ICD-10-CM

## 2011-09-16 DIAGNOSIS — I10 Essential (primary) hypertension: Secondary | ICD-10-CM

## 2011-09-16 NOTE — Patient Instructions (Addendum)
Renal Artery Stenosis Renal artery stenosis (RAS) is the narrowing of the artery that supplies blood to the kidney. If the narrowing is critical and the kidney does not get enough blood, hypertension (high blood pressure) can develop. This is called renal vascular hypertension (RVH). This is a common, uncommon cause of secondary hypertension. It does not usually happen until there is at least a 70% narrowing of the artery. Decreased blood flow through the renal artery causes the kidney to release increased amounts of a hormone. It is called renin. Renin is a strong blood pressure regulator. When it is high, it causes changes that lead to hypertension. Eventually the kidney not receiving enough blood may shrink in size and become less useful. The high blood pressure that is produced can eventually damage and destroy the remaining kidney. This is called hypertensive nephrosclerosis. If both kidneys fail, it will lead to chronic renal failure.  CAUSES  Most renal artery stenosis is caused by a hardening of the arteries (atherosclerosis). This is called Atherosclerotic Renal Artery Stenosis (AS-RAS). It is caused by a build-up of cholesterol (plaques) on the inner lining of the renal artery. A much less common cause is Fibromuscular Dysplasia (FMD). With it, there is an abnormality in the muscular lining of the renal artery. FMD-RAS occurs almost exclusively in women aged 51 to 46. It rarely affects African Americans or Asians.  SYMPTOMS  Often high blood pressure is discovered on a routine blood pressure check. It may be the only sign that something is wrong. Other problems that may occur are:  You may develop calf pain when walking. This is called intermittent claudication. It may be a sign of bad circulation in the legs.   Inability to use certain blood pressure pills such as angiotensin-I (ACE-I) inhibitors or angiotensin receptor blockers (ARB's). These could cause sudden drops in blood pressure with  worsening of kidney function.   More than three antihypertensive medications may be needed for blood pressure control.   New onset of high blood pressure if you are over 55.  DIAGNOSIS  Your caregiver may find suggestions of this on exam if he finds bruits (like murmurs) on listening to your abdomen (belly) or the large arteries in your neck. Your caregiver may also suspect this there is a sudden worsening of your blood pressure when it has been well controlled and you are over age 2. Additional testing that may be done includes:  One diagnostic method used for renal artery stenosis (RAS) is to measure and compare the level of renin, (blood pressure-regulating hormone released by the kidneys), in the right to the left renal veins. If the amount of renin released by one-side is markedly higher than the other, this identifies a high renin-releasing kidney consistent with RAS.   FMD-RAS is often found on renal scan with ACE-inhibitor challenge, or ultrasound with Doppler.   FMD responds well to angioplasty and stenting. The results of stenting in FMD are usually long lasting.  RISK FACTORS  Most renal artery stenosis is caused by a hardening of the arteries. This is called atherosclerosis. Other risk factors associated with the development of atherosclerotic RAS include the following:   Carotid artery disease.   Obesity.   High blood pressure.   Heredity.   Old age.   Fibromuscular dysplasia.   Diabetes mellitus.   Smoking.   Hardening of the arteries.  TREATMENT   Renal vascular hypertension can be very severe. It can also be difficult to control.   Medication is  used to control high blood pressure (hypertension). Blood pressure medications that directly affect the renin angiotensin pathway can be used toe help control blood pressure. ACE inhibitors and angiotensin receptor blockers (ARB's) are often effective in patients with unilateral RAS. In some cases, patients with RAS are  resistant to these medications.   In patients with bilateral RAS, these medications must be used carefully. They may cause acute renal failure (ARF). If acute renal failure develops (if creatinine increases by more than 30%), the medication is discontinued. The patient is evaluated for bilateral RAS.   Angioplasty and stenting may be used to improve blood flow. The goal is to improve the circulation of blood flow to the kidney and prevent the release of excess renin, which can help to decrease blood pressure. This helps to prevent atrophy of the kidney. In general, patients with AS-RAS should have stenting done. This is because plasty by itself has a high incidence of re-stenosis.   Surgery to bypass the narrowing may be done. If the kidney with RAS has diminished in size or strength (atrophied ), surgical removal of the kidney may be advised. This is called nephrectomy.  Document Released: 03/12/2005 Document Revised: 06/05/2011 Document Reviewed: 06/15/2008 [facility] Patient Information 2012 [place], [place].    Vitamin D Deficiency  Not having enough vitamin D is called a deficiency. Your body needs this vitamin to keep your bones strong and healthy. Having too little of it can make your bones soft or can cause other health problems.  HOME CARE  Take all vitamins, herbs, or nutrition drinks (supplements) as told by your doctor.   Have your blood tested 2 months after taking vitamins, herbs, or nutrition drinks.   Eat foods that have vitamin D. This includes:   Dairy products, cereals, or juices with added vitamin D. Check the label.   Fatty fish like salmon or trout.   Eggs.   Oysters.   Go outside for 10 to 15 minutes when the sun is shining. Do this 3 times a week. Do not do this if you have skin cancer.   Do not use tanning beds.   Stay at a healthy weight. Lose weight if needed.   Keep all doctor visits as told.  GET HELP IF:  You have questions.   You continue to have  problems.   You feel sick to your stomach (nauseous) or throw up (vomit).   You cannot go poop (constipated).   You feel confused.   You have severe belly (abdominal) or back pain.  MAKE SURE YOU:  Understand these instructions.   Will watch your condition.   Will get help right away if you are not doing well or get worse.  Document Released: 06/05/2011 Document Reviewed: 06/03/2011 [employer] Patient Information 2012 [place], [facility]     Thyroid Diseases Your thyroid is a butterfly-shaped gland in your neck. It is located just above your collarbone. It is one of your endocrine glands, which make hormones. The thyroid helps set your metabolism. Metabolism is how your body gets energy from the foods you eat.  Millions of people have thyroid diseases. Women experience thyroid problems more often than men. In fact, overactive thyroid problems (hyperthyroidism) occur in 1% of all women. If you have a thyroid disease, your body may use energy more slowly or quickly than it should.  Thyroid problems also include an immune disease where your body reacts against your thyroid gland (called thyroiditis). A different problem involves lumps and bumps (called nodules) that  develop in the gland. The nodules are usually, but not always, noncancerous. THE MOST COMMON THYROID PROBLEMS AND CAUSES ARE DISCUSSED BELOW There are many causes for thyroid problems. Treatment depends upon the exact diagnosis and includes trying to reset your body's metabolism to a normal rate. Hyperthyroidism Too much thyroid hormone from an overactive thyroid gland is called hyperthyroidism. In hyperthyroidism, the body's metabolism speeds up. One of the most frequent forms of hyperthyroidism is known as Graves' disease. Graves' disease tends to run in families. Although Graves' is thought to be caused by a problem with the immune system, the exact nature of the genetic problem is unknown. Hypothyroidism Too little thyroid  hormone from an underactive thyroid gland is called hypothyroidism. In hypothyroidism, the body's metabolism is slowed. Several things can cause this condition. Most causes affect the thyroid gland directly and hurt its ability to make enough hormone.  Rarely, there may be a pituitary gland tumor (located near the base of the brain). The tumor can block the pituitary from producing thyroid-stimulating hormone (TSH). Your body makes TSH to stimulate the thyroid to work properly. If the pituitary does not make enough TSH, the thyroid fails to make enough hormones needed for good health. Whether the problem is caused by thyroid conditions or by the pituitary gland, the result is that the thyroid is not making enough hormones. Hypothyroidism causes many physical and mental processes to become sluggish. The body consumes less oxygen and produces less body heat. Thyroid Nodules A thyroid nodule is a small swelling or lump in the thyroid gland. They are common. These nodules represent either a growth of thyroid tissue or a fluid-filled cyst. Both form a lump in the thyroid gland. Almost half of all people will have tiny thyroid nodules at some point in their lives. Typically, these are not noticeable until they become large and affect normal thyroid size. Larger nodules that are greater than a half inch across (about 1 centimeter) occur in about 5 percent of people. Although most nodules are not cancerous, people who have them should seek medical care to rule out cancer. Also, some thyroid nodules may produce too much thyroid hormone or become too large. Large nodules or a large gland can interfere with breathing or swallowing or may cause neck discomfort. Other problems Other thyroid problems include cancer and thyroiditis. Thyroiditis is a malfunction of the body's immune system. Normally, the immune system works to defend the body against infection and other problems. When the immune system is not working  properly, it may mistakenly attack normal cells, tissues, and organs. Examples of autoimmune diseases are Hashimoto's thyroiditis (which causes low thyroid function) and Graves' disease (which causes excess thyroid function). SYMPTOMS  Symptoms vary greatly depending upon the exact type of problem with the thyroid. Hyperthyroidism-is when your thyroid is too active and makes more thyroid hormone than your body needs. The most common cause is Graves' Disease. Too much thyroid hormone can cause some or all of the following symptoms:  Anxiety.   Irritability.   Difficulty sleeping.   Fatigue.   A rapid or irregular heartbeat.   A fine tremor of your hands or fingers.   An increase in perspiration.   Sensitivity to heat.   Weight loss, despite normal food intake.   Brittle hair.   Enlargement of your thyroid gland (goiter).   Light menstrual periods.   Frequent bowel movements.  Graves' disease can specifically cause eye and skin problems. The skin problems involve reddening and swelling of  the skin, often on your shins and on the top of your feet. Eye problems can include the following:  Excess tearing and sensation of grit or sand in either or both eyes.   Reddened or inflamed eyes.   Widening of the space between your eyelids.   Swelling of the lids and tissues around the eyes.   Light sensitivity.   Ulcers on the cornea.   Double vision.   Limited eye movements.   Blurred or reduced vision.  Hypothyroidism- is when your thyroid gland is not active enough. This is more common than hyperthyroidism. Symptoms can vary a lot depending of the severity of the hormone deficiency. Symptoms may develop over a long period of time and can include several of the following:  Fatigue.   Sluggishness.   Increased sensitivity to cold.   Constipation.   Pale, dry skin.   A puffy face.   Hoarse voice.   High blood cholesterol level.   Unexplained weight gain.    Muscle aches, tenderness and stiffness.   Pain, stiffness or swelling in your joints.   Muscle weakness.   Heavier than normal menstrual periods.   Brittle fingernails and hair.   Depression.  Thyroid Nodules - most do not cause signs or symptoms. Occasionally, some may become so large that you can feel or even see the swelling at the base of your neck. You may realize a lump or swelling is there when you are shaving or putting on makeup. Men might become aware of a nodule when shirt collars suddenly feel too tight. Some nodules produce too much thyroid hormone. This can produce the same symptoms as hyperthyroidism (see above). Thyroid nodules are seldom cancerous. However, a nodule is more likely to be malignant (cancerous) if it:  Grows quickly or feels hard.   Causes you to become hoarse or to have trouble swallowing or breathing.   Causes enlarged lymph nodes under your jaw or in your neck.  DIAGNOSIS  Because there are so many possible thyroid conditions, your caregiver may ask for a number of tests. They will do this in order to narrow down the exact diagnosis. These tests can include:  Blood and antibody tests.   Special thyroid scans using small, safe amounts of radioactive iodine.   Ultrasound of the thyroid gland (particularly if there is a nodule or lump).   Biopsy. This is usually done with a special needle. A needle biopsy is a procedure to obtain a sample of cells from the thyroid. The tissue will be tested in a lab and examined under a microscope.  TREATMENT  Treatment depends on the exact diagnosis. Hyperthyroidism  Beta-blockers help relieve many of the symptoms.   Anti-thyroid medications prevent the thyroid from making excess hormones.   Radioactive iodine treatment can destroy overactive thyroid cells. The iodine can permanently decrease the amount of hormone produced.   Surgery to remove the thyroid gland.   Treatments for eye problems that come from  Graves' disease also include medications and special eye surgery, if felt to be appropriate.  Hypothyroidism Thyroid replacement with levothyroxine is the mainstay of treatment. Treatment with thyroid replacement is usually lifelong and will require monitoring and adjustment from time to time. Thyroid Nodules  Watchful waiting. If a small nodule causes no symptoms or signs of cancer on biopsy, then no treatment may be chosen at first. Re-exam and re-checking blood tests would be the recommended follow-up.   Anti-thyroid medications or radioactive iodine treatment may be recommended if  the nodules produce too much thyroid hormone (see Treatment for Hyperthyroidism above).   Alcohol ablation. Injections of small amounts of ethyl alcohol (ethanol) can cause a non-cancerous nodule to shrink in size.   Surgery (see Treatment for Hyperthyroidism above).  HOME CARE INSTRUCTIONS   Take medications as instructed.   Follow through on recommended testing.  SEEK MEDICAL CARE IF:   You feel that you are developing symptoms of Hyperthyroidism or Hypothyroidism as described above.   You develop a new lump/nodule in the neck/thyroid area that you had not noticed before.   You feel that you are having side effects from medicines prescribed.   You develop trouble breathing or swallowing.  SEEK IMMEDIATE MEDICAL CARE IF:   You develop a fever of 102 F (38.9 C) or higher.   You develop severe sweating.   You develop palpitations and/or rapid heart beat.   You develop shortness of breath.   You develop nausea and vomiting.   You develop extreme shakiness.   You develop agitation.   You develop lightheadedness or have a fainting episode.  Document Released: 04/13/2007 Document Revised: 06/05/2011 Document Reviewed: 04/13/2007 [facility] Patient Information 2012 [place], [place].

## 2011-09-16 NOTE — Progress Notes (Signed)
  Subjective:    Patient ID: Nicole Abbott, female    DOB: 1946/03/09, 66 y.o.   MRN: [id number]  HPI  This 66 y.o. Retired Engineer, civil (consulting) has malignant HTN which has been evaluated x2 at [facility] 102 and recent hospitalization X2; I reviewed with the patient the results of ECHO during mid-February hospitaization; it showed well preserved EF=  60-65% and moderate thickening of the Aortic Valve. Recent labs are normal. She c/o dyspnea and mild headache  within last 4-6 weeks; this was her presenting complaint back in Feb at [facility]. Sh is scheduled to see a Cardiologist  September 22, 2011. She states compliance with all medications. She is concerned about her weight but states she   can no longer exercise (walk up to 5 miles daily) like she used to because of SOB and joint/ back pain.    Review of Systems  Constitutional: Positive for activity change, fatigue and unexpected weight change. Negative for fever and appetite change.  Respiratory: Positive for chest tightness and shortness of breath. Negative for apnea, cough and wheezing.   Cardiovascular: Positive for palpitations. Negative for chest pain.  Musculoskeletal: Positive for back pain and arthralgias.  Neurological: Positive for headaches. Negative for syncope, facial asymmetry and weakness.  Psychiatric/Behavioral: Negative for confusion, dysphoric mood and agitation. The patient is nervous/anxious.        Objective:   Physical Exam  Constitutional: She is oriented to person, place, and time. She appears well-developed and well-nourished. No distress.       Moderately obese  HENT:  Head: Normocephalic and atraumatic.  Nose: Nose normal.  Mouth/Throat: Oropharynx is clear and moist.  Eyes: EOM are normal. No scleral icterus.  Neck: Normal range of motion. Neck supple. No thyromegaly present.  Cardiovascular: Normal rate, regular rhythm and normal heart sounds.  Exam reveals no gallop and no friction rub.   No murmur  heard. Pulmonary/Chest: Effort normal and breath sounds normal. No respiratory distress. She has no wheezes. She has no rales.       Suboptimal inspiratory effort  Musculoskeletal: She exhibits edema.       Back/spine: decreased ROM and tenderness to palpation over lumbar spine  Lymphadenopathy:    She has no cervical adenopathy.  Neurological: She is alert and oriented to person, place, and time. She has normal reflexes. No cranial nerve deficit. Coordination normal.  Skin: Skin is warm and dry.  Psychiatric: She has a normal mood and affect. Her behavior is normal. Judgment and thought content normal.          Assessment & Plan:   1. HTN (hypertension), malignant  Continue current medications  2. Obesity, Class III, BMI 40-49.9 (morbid obesity)  Have suggested that all available resources such as Nutrition Counseling be utilized after Cardiac issue is fully evaluated

## 2011-09-17 LAB — TSH: TSH: 2.651 u[IU]/mL (ref 0.350–4.500)

## 2011-09-17 LAB — T4, FREE: Free T4: 0.95 ng/dL (ref 0.80–1.80)

## 2011-09-17 LAB — VITAMIN D 25 HYDROXY (VIT D DEFICIENCY, FRACTURES): Vit D, 25-Hydroxy: 54 ng/mL (ref 30–89)

## 2011-09-22 ENCOUNTER — Ambulatory Visit: Payer: Medicare Other | Admitting: Cardiovascular Disease

## 2011-09-24 ENCOUNTER — Telehealth: Payer: Self-pay

## 2011-09-24 MED ORDER — LISINOPRIL 40 MG PO TABS: 40.0000 mg | ORAL_TABLET | Freq: Every morning | ORAL | Status: DC

## 2011-09-24 MED ORDER — HYDROCHLOROTHIAZIDE 25 MG PO TABS: 12.5000 mg | ORAL_TABLET | Freq: Every day | ORAL | Status: DC

## 2011-09-24 MED ORDER — LABETALOL HCL 200 MG PO TABS: 200.0000 mg | ORAL_TABLET | Freq: Three times a day (TID) | ORAL | Status: DC

## 2011-09-24 NOTE — Telephone Encounter (Signed)
Pt was prescribed labetalol 200 mg and hydrochlorothiazide upon discharge from hospital. Pt came here for followup and dr Audria Nine is referring pt to cardio. So no rx was written. Pt missed the original referral appt due to a death in her family. She rescheduled appt for April 29, but now needs refill on the 2 meds rec'd from the hospital plus a refill for lisinopril.   [facility] on [address]     [phone]

## 2011-09-24 NOTE — Telephone Encounter (Signed)
Pt.notified

## 2011-09-24 NOTE — Telephone Encounter (Signed)
Sent in a 1 month supply

## 2011-09-24 NOTE — Progress Notes (Signed)
Quick Note:  Please notify pt that results are normal.   Provide pt with copy of labs. ______ 

## 2011-10-27 ENCOUNTER — Ambulatory Visit: Payer: Medicare Other | Admitting: Cardiovascular Disease

## 2011-11-03 ENCOUNTER — Telehealth: Payer: Self-pay

## 2011-11-03 NOTE — Telephone Encounter (Signed)
NEEDS REFILL ON HYDROCHLOROTHIAZIDE 25 MG AND LABETALOL 200 MG  PHARMACY SAYS SHE NEEDS AUTHORIZATION

## 2011-11-04 MED ORDER — LABETALOL HCL 200 MG PO TABS: 200.0000 mg | ORAL_TABLET | Freq: Three times a day (TID) | ORAL | Status: DC

## 2011-11-04 MED ORDER — HYDROCHLOROTHIAZIDE 25 MG PO TABS: 12.5000 mg | ORAL_TABLET | Freq: Every day | ORAL | Status: DC

## 2011-11-04 NOTE — Telephone Encounter (Signed)
Notified pt that RFs were sent to pharmacy. Pt had to reschedule her cardiologist appt (d/t death in family she had to leave town on day of appt), but has appt rescheduled for 11/14/11. Pt also has f/up w/Dr Audria Nine on 11/18/11

## 2011-11-04 NOTE — Telephone Encounter (Signed)
Ok to refill Labetalol 200mg  TID & HCTZ 25mg  (takes 0.5-1.0 tablet daily)

## 2011-11-04 NOTE — Telephone Encounter (Signed)
Ok to refill? Labetaolol 200 TID,

## 2011-11-04 NOTE — Telephone Encounter (Signed)
Meds refilled.  Did she see Cardiology 09/22/11? What's f/u with Dr. Audria Nine?

## 2011-11-13 ENCOUNTER — Other Ambulatory Visit: Payer: Self-pay | Admitting: Physician Assistant

## 2011-11-14 ENCOUNTER — Ambulatory Visit (INDEPENDENT_AMBULATORY_CARE_PROVIDER_SITE_OTHER): Payer: Medicare Other | Admitting: Cardiovascular Disease

## 2011-11-14 ENCOUNTER — Encounter: Payer: Self-pay | Admitting: Cardiovascular Disease

## 2011-11-14 VITALS — BP 170/82 | HR 73 | Ht 64.0 in | Wt 242.4 lb

## 2011-11-14 DIAGNOSIS — I10 Essential (primary) hypertension: Secondary | ICD-10-CM

## 2011-11-14 DIAGNOSIS — M797 Fibromyalgia: Secondary | ICD-10-CM

## 2011-11-14 DIAGNOSIS — IMO0001 Reserved for inherently not codable concepts without codable children: Secondary | ICD-10-CM

## 2011-11-14 MED ORDER — LABETALOL HCL 300 MG PO TABS: 300.0000 mg | ORAL_TABLET | Freq: Three times a day (TID) | ORAL | Status: DC

## 2011-11-14 NOTE — Progress Notes (Signed)
Nicole Abbott Date of Birth  December 14, 1945       [facility]    [place] [address], [address]  [address], [address] [place], [place]  [address]   [place], [place]  [address] [phone]     [phone]   Fax  [phone]    Fax [phone]  Problem List: 1. Hypertension 2. Left ventricular hypertrophy by echo 08/08/11 Left ventricle: There was moderate concentric hypertrophy. Systolic function was normal. The estimated ejection fraction was in the range of 60% to 65%. Wall motion was normal; there were no regional wall motion abnormalities. Features are consistent with a pseudonormal left ventricular filling pattern, with concomitant abnormal relaxation and increased filling pressure (grade 2 diastolic dysfunction). Doppler parameters are consistent with high ventricular filling pressure.  3. Fibromyalgia 4. Back pain  History of Present Illness:  Nicole Abbott is a 66 y.o. female with hx of HTN.  She was recently admitted to the hospital 2 months ago with episodes of chest pain and shortness of breath. 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 Her medicines were adjusted. She had an echocardiogram during the hospitalization which revealed moderate left ventricular hypertrophy.  She does not get any regular exercise.  She eats some bacon and sausage on occasion.  She does not eat lots of salt.    We talked about her fibromyalgia. She admits to not sleeping well. She knows that she is a light sleeper and tosses and turns much of the night. She does does not know if she snores.  Current Outpatient Prescriptions on File Prior to Visit  Medication Sig Dispense Refill  . aspirin 81 MG tablet Take 81 mg by mouth every morning.      . docusate-casanthranol (PERICOLACE) 100-30 MG per capsule Take 3 capsules by mouth daily.       . famotidine (PEPCID AC) 10 MG chewable tablet Chew 20 mg by mouth 2 (two) times daily.      . hydrochlorothiazide  (HYDRODIURIL) 25 MG tablet Take 0.5-1 tablets (12.5-25 mg total) by mouth daily.  90 tablet  0  . labetalol (NORMODYNE) 200 MG tablet Take 1 tablet (200 mg total) by mouth 3 (three) times daily.  90 tablet  0  . lisinopril (PRINIVIL,ZESTRIL) 40 MG tablet TAKE 1 TABLET BY MOUTH EVERY MORNING  30 tablet  1  . magnesium oxide (MAG-OX) 400 MG tablet Take 400 mg by mouth at bedtime.      . Multiple Vitamins-Minerals (MULTIVITAMIN WITH MINERALS) tablet Take 1 tablet by mouth daily.      Mar[place] Kitchen NAPROXEN SODIUM PO Take by mouth daily.        Allergies  Allergen Reactions  . Penicillins Anaphylaxis    Past Medical History  Diagnosis Date  . Hypertension   . Asthma   . Arthritis     Past Surgical History  Procedure Date  . Tubal ligation     History  Smoking status  . Former Smoker  . Quit date: 07/01/1987  Smokeless tobacco  . Not on file    History  Alcohol Use  . Yes    Occasional wine drinker    Family History  Problem Relation Age of Onset  . Diabetes Mother   . Hypertension Mother   . Diabetes Sister   . Hypertension Sister   . Thyroid disease Sister   . Thyroid disease Brother   . Asthma Son   . Asthma Grandchild   . Asthma Grandchild   .  Asthma Grandchild     Reviw of Systems:  Reviewed in the HPI.  All other systems are negative.  Physical Exam: Blood pressure 170/82, pulse 73, height 5\' 4"  (1.626 m), weight 242 lb 6.4 oz (109.952 kg). General: Well developed, well nourished, in no acute distress.  Head: Normocephalic, atraumatic, sclera non-icteric, mucus membranes are moist,   Neck: Supple. Carotids are 2 + without bruits. No JVD  Lungs: Clear bilaterally to auscultation.  Heart: regular rate.  normal  S1 S2. No murmurs, gallops or rubs.  Abdomen: Soft, non-tender, non-distended with normal bowel sounds. No hepatomegaly. No rebound/guarding. No masses.  Msk:  Strength and tone are normal  Extremities: No clubbing or cyanosis. No edema.  Distal pedal  pulses are 2+ and equal bilaterally.  Neuro: Alert and oriented X 3. Moves all extremities spontaneously.  Psych:  Responds to questions appropriately with a normal affect.  ECG: Nov 14, 2011-normal sinus rhythm. Heart rate of 77. Normal EKG.  Assessment / Plan:

## 2011-11-14 NOTE — Assessment & Plan Note (Signed)
Nicole Abbott presents today for further evaluation of her hypertension. [name] been on numerous medications. She was recently admitted to the hospital with a hypertensive crisis.  [name] had fairly good luck with the labetalol. [name] tried hydralazine in the past but it did not seem to see her. [name] tried amlodipine in the past that caused diffuse swelling of her hands, face, feet, and abdomen.  [name] currently on hydrochlorothiazide but does not like it because it tends to make her hair fall out.  We will increase labetalol to 300 mg 3 times a day. I suspect that she might have sleep apnea. We'll get a sleep study for further evaluation.  He also has a history of fibromyalgia which would also go along with sleep apnea. I'll see her again in 2 months for followup visit.

## 2011-11-14 NOTE — Patient Instructions (Signed)
Your physician recommends that you schedule a follow-up appointment in: 2 months   Your physician has recommended that you have a sleep study. This test records several body functions during sleep, including: brain activity, eye movement, oxygen and carbon dioxide blood levels, heart rate and rhythm, breathing rate and rhythm, the flow of air through your mouth and nose, snoring, body muscle movements, and chest and belly movement.   Your physician has recommended you make the following change in your medication:   INCREASE LABETALOL TO 300 MG ONE TABLET THREE TIMES A DAY 8 HOURS APART   Your physician recommends that you return for lab work in: 2 MONTHS//BMET   DASH Diet The DASH diet stands for "Dietary Approaches to Stop Hypertension." It is a healthy eating plan that has been shown to reduce high blood pressure (hypertension) in as little as 14 days, while also possibly providing other significant health benefits. These other health benefits include reducing the risk of breast cancer after menopause and reducing the risk of type 2 diabetes, heart disease, colon cancer, and stroke. Health benefits also include weight loss and slowing kidney failure in patients with chronic kidney disease.  DIET GUIDELINES  Limit salt (sodium). Your diet should contain less than 1500 mg of sodium daily.   Limit refined or processed carbohydrates. Your diet should include mostly whole grains. Desserts and added sugars should be used sparingly.   Include small amounts of heart-healthy fats. These types of fats include nuts, oils, and tub margarine. Limit saturated and trans fats. These fats have been shown to be harmful in the body.  CHOOSING FOODS  The following food groups are based on a 2000 calorie diet. See your Registered Dietitian for individual calorie needs. Grains and Grain Products (6 to 8 servings daily)  Eat More Often: Whole-wheat bread, brown rice, whole-grain or wheat pasta, quinoa, popcorn  without added fat or salt (air popped).   Eat Less Often: White bread, white pasta, white rice, cornbread.  Vegetables (4 to 5 servings daily)  Eat More Often: Fresh, frozen, and canned vegetables. Vegetables may be raw, steamed, roasted, or grilled with a minimal amount of fat.   Eat Less Often/Avoid: Creamed or fried vegetables. Vegetables in a cheese sauce.  Fruit (4 to 5 servings daily)  Eat More Often: All fresh, canned (in natural juice), or frozen fruits. Dried fruits without added sugar. One hundred percent fruit juice ( cup [237 mL] daily).   Eat Less Often: Dried fruits with added sugar. Canned fruit in light or heavy syrup.  [employer], Fish, and Poultry (2 servings or less daily. One serving is 3 to 4 oz [85-114 g]).  Eat More Often: Ninety percent or leaner ground beef, tenderloin, sirloin. Round cuts of beef, chicken breast, [place] breast. All fish. Grill, bake, or broil your meat. Nothing should be fried.   Eat Less Often/Avoid: Fatty cuts of meat, [place], or chicken leg, thigh, or wing. Fried cuts of meat or fish.  Dairy (2 to 3 servings)  Eat More Often: Low-fat or fat-free milk, low-fat plain or light yogurt, reduced-fat or part-skim cheese.   Eat Less Often/Avoid: Milk (whole, 2%, skim, or chocolate).Whole milk yogurt. Full-fat cheeses.  Nuts, Seeds, and Legumes (4 to 5 servings per week)  Eat More Often: All without added salt.   Eat Less Often/Avoid: Salted nuts and seeds, canned beans with added salt.  Fats and Sweets (limited)  Eat More Often: Vegetable oils, tub margarines without trans fats, sugar-free gelatin. Mayonnaise  and salad dressings.   Eat Less Often/Avoid: Coconut oils, palm oils, butter, stick margarine, cream, half and half, cookies, candy, pie.  FOR MORE INFORMATION The Dash Diet Eating Plan: www.dashdiet.org Document Released: 06/05/2011 Document Reviewed: 05/26/2011 [facility] Patient Information 2012 [place], [place].

## 2011-11-18 ENCOUNTER — Ambulatory Visit (INDEPENDENT_AMBULATORY_CARE_PROVIDER_SITE_OTHER): Payer: Medicare Other | Admitting: Family Medicine

## 2011-11-18 ENCOUNTER — Telehealth: Payer: Self-pay | Admitting: *Deleted

## 2011-11-18 ENCOUNTER — Ambulatory Visit: Payer: Medicare Other | Admitting: Family Medicine

## 2011-11-18 VITALS — BP 177/77 | HR 61 | Temp 99.3°F | Resp 18 | Ht 64.0 in | Wt 241.2 lb

## 2011-11-18 DIAGNOSIS — I10 Essential (primary) hypertension: Secondary | ICD-10-CM

## 2011-11-18 DIAGNOSIS — M545 Low back pain: Secondary | ICD-10-CM

## 2011-11-18 MED ORDER — CLONIDINE HCL 0.1 MG PO TABS: 0.1000 mg | ORAL_TABLET | Freq: Every day | ORAL | Status: DC

## 2011-11-18 MED ORDER — MOMETASONE FUROATE 50 MCG/ACT NA SUSP: 2.0000 | Freq: Every day | NASAL | Status: DC

## 2011-11-18 NOTE — Telephone Encounter (Signed)
Left message for patient to call to schedule sleep study.

## 2011-11-18 NOTE — Progress Notes (Signed)
  Subjective:    Patient ID: Nicole Abbott, female    DOB: 1946-05-23, 66 y.o.   MRN: [id number]  HPI  Patient presents in routine follow up of malignant hypertension. Hypertension complicated by LVH.  On Labetalol 300 mg TID patient states her pulse was in the 40's and experienced significant malaise and fatigue.  Patient dropped her dose of medication to 200 mg TID and symptoms have improved.  Monitors dietary salt and cholesterol Patient states activity limited by lower back pain and (L) shoulder pain  Weight gain of 20 pound since retiring 1 years age  Review of Systems     Objective:   Physical Exam  Constitutional: She appears well-developed.  HENT:       Boggy, swollen turbinates  Neck: Neck supple. No JVD present.  Cardiovascular: Normal rate, regular rhythm and normal heart sounds.   Pulmonary/Chest: Effort normal and breath sounds normal.  Abdominal: Soft. Bowel sounds are normal.  Musculoskeletal:       Lumbar back: She exhibits tenderness (musculoskeletal tenderness across L/S spine).  Neurological: She is alert. She has normal strength and normal reflexes.  Reflex Scores:      Patellar reflexes are 2+ on the right side and 2+ on the left side.      Neg SLR (B)  Skin: Skin is warm.       Trace LE edema          Assessment & Plan:   1. HTN (hypertension)  cloNIDine (CATAPRES) 0.1 MG tablet  2. LBP (low back pain)  Ambulatory referral to Physical Medicine Rehab  3. Allergic rhinitis  mometasone (NASONEX) 50 MCG/ACT nasal spray   Encouraged patient to follow thorugh with sleep study [employer] for swim aerobics given chronic LBP Referral to Dr. Ethelene Hal Add Clonidine QHS Flonase UAD

## 2011-11-19 ENCOUNTER — Other Ambulatory Visit: Payer: Self-pay | Admitting: Physician Assistant

## 2011-12-09 ENCOUNTER — Encounter (HOSPITAL_BASED_OUTPATIENT_CLINIC_OR_DEPARTMENT_OTHER): Payer: Medicare Other

## 2011-12-09 ENCOUNTER — Ambulatory Visit: Payer: Medicare Other | Admitting: Family Medicine

## 2011-12-11 ENCOUNTER — Telehealth: Payer: Self-pay | Admitting: *Deleted

## 2011-12-11 NOTE — Telephone Encounter (Signed)
Pt will call office when ready to reschedule sleep study, told her the order is here and she just needs to call to schedule, pt agreed to plan.

## 2011-12-11 NOTE — Telephone Encounter (Signed)
Message copied by Antony Odea on Thu Dec 11, 2011  4:14 PM ------      Message from: Connye Burkitt      Created: Wed Nov 19, 2011  9:01 AM      Regarding: sleep center referral       Ms. Hrivnak has an appointment in 12/09/11 @ 8 pm            [facility]

## 2011-12-17 ENCOUNTER — Encounter: Payer: Self-pay | Admitting: Cardiovascular Disease

## 2011-12-19 ENCOUNTER — Ambulatory Visit: Payer: Medicare Other | Admitting: Family Medicine

## 2012-01-21 ENCOUNTER — Encounter: Payer: Self-pay | Admitting: Cardiovascular Disease

## 2012-01-21 ENCOUNTER — Ambulatory Visit (INDEPENDENT_AMBULATORY_CARE_PROVIDER_SITE_OTHER): Payer: Medicare Other | Admitting: Cardiovascular Disease

## 2012-01-21 VITALS — BP 155/90 | HR 67 | Ht 64.0 in | Wt 241.4 lb

## 2012-01-21 DIAGNOSIS — I10 Essential (primary) hypertension: Secondary | ICD-10-CM

## 2012-01-21 LAB — BASIC METABOLIC PANEL
CO2: 29 mEq/L (ref 19–32)
Chloride: 103 mEq/L (ref 96–112)
Potassium: 3.8 mEq/L (ref 3.5–5.1)
Sodium: 140 mEq/L (ref 135–145)

## 2012-01-21 MED ORDER — LISINOPRIL 40 MG PO TABS: 40.0000 mg | ORAL_TABLET | Freq: Every day | ORAL | Status: DC

## 2012-01-21 MED ORDER — CLONIDINE HCL 0.1 MG PO TABS: 0.1000 mg | ORAL_TABLET | Freq: Two times a day (BID) | ORAL | Status: DC

## 2012-01-21 NOTE — Progress Notes (Signed)
Nicole Abbott Date of Birth  May 07, 1946       [facility]    [place] [address]  [address] [place], [place]  [address]   [place], [place]  [address] [phone]     [phone]   Fax  [phone]    Fax [phone]  Problem List: 1. Hypertension 2. Left ventricular hypertrophy by echo 08/08/11 Left ventricle: There was moderate concentric hypertrophy. Systolic function was normal. The estimated ejection fraction was in the range of 60% to 65%. Wall motion was normal; there were no regional wall motion abnormalities. Features are consistent with a pseudonormal left ventricular filling pattern, with concomitant abnormal relaxation and increased filling pressure (grade 2 diastolic dysfunction). Doppler parameters are consistent with high ventricular filling pressure.  3. Fibromyalgia 4. Back pain  History of Present Illness:  Nicole Abbott is a 66 y.o. female with hx of HTN.  She was recently admitted to the hospital 2 months ago with episodes of chest pain and shortness of breath. 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 Her medicines were adjusted. She had an echocardiogram during the hospitalization which revealed moderate left ventricular hypertrophy.  She does not get any regular exercise.  She eats some bacon and sausage on occasion.  She does not eat lots of salt.  She started taking clonidine. Her back pain is much better.  We talked about her fibromyalgia.  She thinks that she is sleeping better.  She forgot to take her clonidine last night.   Current Outpatient Prescriptions on File Prior to Visit  Medication Sig Dispense Refill  . aspirin 81 MG tablet Take 81 mg by mouth every morning.      . cloNIDine (CATAPRES) 0.1 MG tablet Take 1 tablet (0.1 mg total) by mouth at bedtime.  30 tablet  3  . docusate-casanthranol (PERICOLACE) 100-30 MG per capsule Take 3 capsules by mouth daily.       . famotidine (PEPCID AC)  10 MG chewable tablet Chew 20 mg by mouth 2 (two) times daily.      . hydrochlorothiazide (HYDRODIURIL) 25 MG tablet Take 0.5-1 tablets (12.5-25 mg total) by mouth daily.  90 tablet  0  . lisinopril (PRINIVIL,ZESTRIL) 40 MG tablet TAKE 1 TABLET BY MOUTH EVERY MORNING  30 tablet  1  . magnesium oxide (MAG-OX) 400 MG tablet Take 400 mg by mouth at bedtime.      . mometasone (NASONEX) 50 MCG/ACT nasal spray Place 2 sprays into the nose daily.  1 g  12  . Multiple Vitamins-Minerals (MULTIVITAMIN WITH MINERALS) tablet Take 1 tablet by mouth daily.      Marland Kitchen NAPROXEN SODIUM PO Take by mouth daily.      Bertram Gala Glycol-Propyl Glycol (SYSTANE OP) Apply to eye 2 (two) times daily.      Marland Kitchen DISCONTD: labetalol (NORMODYNE) 300 MG tablet Take 1 tablet (300 mg total) by mouth 3 (three) times daily.  90 tablet  5    Allergies  Allergen Reactions  . Penicillins Anaphylaxis    Past Medical History  Diagnosis Date  . Hypertension   . Asthma   . Arthritis     Past Surgical History  Procedure Date  . Tubal ligation     History  Smoking status  . Former Smoker  . Quit date: 07/01/1987  Smokeless tobacco  . Not on file    History  Alcohol Use  . Yes    Occasional wine drinker  Family History  Problem Relation Age of Onset  . Diabetes Mother   . Hypertension Mother   . Diabetes Sister   . Hypertension Sister   . Thyroid disease Sister   . Thyroid disease Brother   . Asthma Son   . Asthma Grandchild   . Asthma Grandchild   . Asthma Grandchild     Reviw of Systems:  Reviewed in the HPI.  All other systems are negative.  Physical Exam: Blood pressure 180/100, pulse 67, height 5\' 4"  (1.626 m), weight 241 lb 6.4 oz (109.498 kg), SpO2 99.00%. General: Well developed, well nourished, in no acute distress.  Head: Normocephalic, atraumatic, sclera non-icteric, mucus membranes are moist,   Neck: Supple. Carotids are 2 + without bruits. No JVD  Lungs: Clear bilaterally to  auscultation.  Heart: regular rate.  normal  S1 S2. No murmurs, gallops or rubs.  Abdomen: Soft, non-tender, non-distended with normal bowel sounds. No hepatomegaly. No rebound/guarding. No masses.  Msk:  Strength and tone are normal  Extremities: No clubbing or cyanosis. No edema.  Distal pedal pulses are 2+ and equal bilaterally.  Neuro: Alert and oriented X 3. Moves all extremities spontaneously.  Psych:  Responds to questions appropriately with a normal affect.  ECG:  Assessment / Plan:

## 2012-01-21 NOTE — Assessment & Plan Note (Addendum)
Nicole Abbott's blood pressure remains elevated. It is a little bit better at the end of the office visit. We'll increase her clonidine to 0.1 mg twice a day. She seems to be doing well with clonidine. She was hesitant about starting Bystolic. [name] not really taking her labetalol as prescribed.   We will discontinue the labetalol increase the clonidine.   We'll check a basic profile today. I will see her in 3 months for follow up ov.

## 2012-01-21 NOTE — Patient Instructions (Addendum)
Your physician recommends that you return for lab work in: today bmet  Your physician recommends that you schedule a follow-up appointment in: 3 months   Your physician has recommended you make the following change in your medication:   INCREASE CLONIDINE 0.1 MG TO TWICE DAILY 12 HOURS APART. STOP LABETALOL

## 2012-01-22 ENCOUNTER — Telehealth: Payer: Self-pay | Admitting: Cardiovascular Disease

## 2012-01-22 DIAGNOSIS — E876 Hypokalemia: Secondary | ICD-10-CM

## 2012-01-22 MED ORDER — POTASSIUM CHLORIDE CRYS ER 10 MEQ PO TBCR: 10.0000 meq | EXTENDED_RELEASE_TABLET | Freq: Every day | ORAL | Status: DC

## 2012-01-22 NOTE — Telephone Encounter (Signed)
Message copied by Antony Odea on Thu Jan 22, 2012  1:18 PM ------      Message from: Vesta Mixer      Created: Wed Jan 21, 2012  6:17 PM       Add Kdur 10 qd and recheck in 1 month

## 2012-01-22 NOTE — Telephone Encounter (Signed)
Results given, 1 month lab draw set, med script sent

## 2012-01-22 NOTE — Telephone Encounter (Signed)
Patient returning nurse call, she can be reached at [phone]

## 2012-01-26 ENCOUNTER — Telehealth: Payer: Self-pay

## 2012-01-26 MED ORDER — HYDROCHLOROTHIAZIDE 25 MG PO TABS: 12.5000 mg | ORAL_TABLET | Freq: Every day | ORAL | Status: DC

## 2012-01-26 NOTE — Telephone Encounter (Signed)
[facility] [address] and [address] wants renewal on Hydrouiuril, she called pharmacy and they could not find in file. Please advise, was here to see Dr Hal Hope May 2013 but she mentions Clonidine 0.1 as Rx given.

## 2012-01-26 NOTE — Telephone Encounter (Signed)
Authorized 1 month but then will be due for office visit/recheck

## 2012-01-26 NOTE — Telephone Encounter (Signed)
PT STATES THE PHARMACY HAD REQUESTED A REFILL ON HER HYDRODIURIL AND WAS TOLD WE COULDN'T LOCATE HER BY THE DOB (WHICH WAS THE CORRECT DOB) PLEASE CALL [phone]   [facility] [address]

## 2012-01-27 NOTE — Telephone Encounter (Signed)
Called patient to advise have sent in the Rx for her.

## 2012-01-30 ENCOUNTER — Ambulatory Visit (INDEPENDENT_AMBULATORY_CARE_PROVIDER_SITE_OTHER): Payer: Medicare Other | Admitting: Emergency Medicine

## 2012-01-30 ENCOUNTER — Ambulatory Visit: Payer: Medicare Other

## 2012-01-30 VITALS — BP 158/74 | HR 90 | Temp 97.8°F | Resp 16 | Wt 241.4 lb

## 2012-01-30 DIAGNOSIS — M79673 Pain in unspecified foot: Secondary | ICD-10-CM

## 2012-01-30 DIAGNOSIS — M79609 Pain in unspecified limb: Secondary | ICD-10-CM

## 2012-01-30 NOTE — Progress Notes (Signed)
Date:  01/30/2012   Name:  Nicole Abbott   DOB:  11/20/1945   MRN:  [id number]  PCP:  Dow Adolph, MD    Chief Complaint: Foot Pain   History of Present Illness:  Nicole Abbott is a 66 y.o. very pleasant female patient who presents with the following:  Pain in lateral left heel for three weeks.  No recollection of injury or overuse.  Pain worse with standing and walking, while sitting and laying down.  Says it radiates up her lateral leg nearly to knee.  No history of back injury or pain. No weakness or numbness.   Patient Active Problem List  Diagnosis  . Hypertension  . Arthritis  . Chest pain  . Hyperlipidemia  . Asthma    Past Medical History  Diagnosis Date  . Hypertension   . Asthma   . Arthritis     Past Surgical History  Procedure Date  . Tubal ligation     History  Substance Use Topics  . Smoking status: Former Smoker    Quit date: 07/01/1987  . Smokeless tobacco: Not on file  . Alcohol Use: Yes     Occasional wine drinker    Family History  Problem Relation Age of Onset  . Diabetes Mother   . Hypertension Mother   . Diabetes Sister   . Hypertension Sister   . Thyroid disease Sister   . Thyroid disease Brother   . Asthma Son   . Asthma Grandchild   . Asthma Grandchild   . Asthma Grandchild     Allergies  Allergen Reactions  . Penicillins Anaphylaxis    Medication list has been reviewed and updated.  Current Outpatient Prescriptions on File Prior to Visit  Medication Sig Dispense Refill  . aspirin 81 MG tablet Take 81 mg by mouth every morning.      . cloNIDine (CATAPRES) 0.1 MG tablet Take 1 tablet (0.1 mg total) by mouth 2 (two) times daily.  60 tablet  3  . docusate-casanthranol (PERICOLACE) 100-30 MG per capsule Take 3 capsules by mouth daily.       . famotidine (PEPCID AC) 10 MG chewable tablet Chew 20 mg by mouth 2 (two) times daily.      Marland Kitchen lisinopril (PRINIVIL,ZESTRIL) 40 MG tablet Take 1 tablet (40 mg total) by  mouth daily.  30 tablet  5  . magnesium oxide (MAG-OX) 400 MG tablet Take 400 mg by mouth at bedtime.      . Multiple Vitamins-Minerals (MULTIVITAMIN WITH MINERALS) tablet Take 1 tablet by mouth daily.      Marland Kitchen NAPROXEN SODIUM PO Take by mouth daily.      Bertram Gala Glycol-Propyl Glycol (SYSTANE OP) Apply to eye 2 (two) times daily.      . potassium chloride (K-DUR,KLOR-CON) 10 MEQ tablet Take 1 tablet (10 mEq total) by mouth daily.  30 tablet  5  . DISCONTD: hydrochlorothiazide (HYDRODIURIL) 25 MG tablet Take 0.5-1 tablets (12.5-25 mg total) by mouth daily. Needs office visit  30 tablet  0  . DISCONTD: mometasone (NASONEX) 50 MCG/ACT nasal spray Place 2 sprays into the nose daily.  1 g  12    Review of Systems:  As per HPI, otherwise negative.    Physical Examination: Filed Vitals:   01/30/12 1322  BP: 158/74  Pulse: 90  Temp: 97.8 F (36.6 C)  Resp: 16   Filed Vitals:   01/30/12 1322  Weight: 241 lb 6.4 oz (109.498 kg)   There  is no height on file to calculate BMI. Ideal Body Weight:     GEN: WDWN, NAD, Non-toxic, Alert & Oriented x 3 HEENT: Atraumatic, Normocephalic.  Ears and Nose: No external deformity. EXTR: No clubbing/cyanosis/edema.  Point tenderness lateral heel.  No ecchymosis, cellulitis, limitation of motion of ankle, warmth or edema. NEURO: Normal gait.  PSYCH: Normally interactive. Conversant. Not depressed or anxious appearing.  Calm demeanor.    Assessment and Plan: Heel pain Is taking anaprox and refused any controlled drug Follow up with podiatrist  [facility] reading (PRIMARY) by  Dr. Dareen Piano.  Heel spur and minimal calcification achilles otherwise negative.    Carmelina Dane, MD

## 2012-02-18 ENCOUNTER — Other Ambulatory Visit (INDEPENDENT_AMBULATORY_CARE_PROVIDER_SITE_OTHER): Payer: Medicare Other

## 2012-02-18 DIAGNOSIS — E876 Hypokalemia: Secondary | ICD-10-CM

## 2012-02-18 LAB — BASIC METABOLIC PANEL
BUN: 13 mg/dL (ref 6–23)
Chloride: 102 mEq/L (ref 96–112)
Glucose, Bld: 112 mg/dL — ABNORMAL HIGH (ref 70–99)
Potassium: 3.8 mEq/L (ref 3.5–5.1)

## 2012-02-19 ENCOUNTER — Telehealth: Payer: Self-pay | Admitting: *Deleted

## 2012-02-19 NOTE — Telephone Encounter (Signed)
Message copied by Antony Odea on Thu Feb 19, 2012  1:40 PM ------      Message from: Vesta Mixer      Created: Wed Feb 18, 2012  4:14 PM       Labs are normal

## 2012-02-19 NOTE — Telephone Encounter (Signed)
Lab results given, Pt to continue potassium, pt has refills.

## 2012-03-04 ENCOUNTER — Encounter: Payer: Self-pay | Admitting: Family Medicine

## 2012-03-04 ENCOUNTER — Ambulatory Visit (INDEPENDENT_AMBULATORY_CARE_PROVIDER_SITE_OTHER): Payer: Medicare Other | Admitting: Family Medicine

## 2012-03-04 VITALS — BP 157/70 | HR 71 | Temp 97.8°F | Resp 18 | Wt 238.0 lb

## 2012-03-04 DIAGNOSIS — I10 Essential (primary) hypertension: Secondary | ICD-10-CM

## 2012-03-04 MED ORDER — HYDROCHLOROTHIAZIDE 25 MG PO TABS: 25.0000 mg | ORAL_TABLET | Freq: Every day | ORAL | Status: DC

## 2012-03-04 NOTE — Patient Instructions (Addendum)

## 2012-03-04 NOTE — Progress Notes (Signed)
S: This 66 y.o. AA emale is here for BP med refill; she was seen by Dr. Melburn Popper in July but she forgot to get HCTZ refilled. She checked BP at home this AM and reports 148/69. She feels like she has "White-coat Syndrome".  Re: weight loss- she knows that she needs to increase her activity but her ultimate goal is 220 pounds because this is the weight she "feels good at". Activity is limited by fibromyalgia and chronic ankle pain; she is considering foot surgery as recommended by her podiatrist.  ROS: No unintentional wegiht loss or gain, diaphoresis, fever; occasional fatigue associated with fibromyalgia. No CP, tightness, SOB, cough, edema, HA, dizziness, weakness or syncope.  O:  Filed Vitals:   03/04/12 1026  BP: 157/70                                  Weight=238 lbs  Pulse: 71  Temp: 97.8 F (36.6 C)  Resp: 18   GEN: In NAD; WN,WD. HENT: [place]/AT; EOMI, conj/scl clear. COR: RRR; no edema LUNGS: Normal resp rate and effort. NEURO: A&O x 3; CNs intact; otherwise, nonfocal.   A/P;  1. HTN (hypertension) - continue current medication and follow-up with Dr. Melburn Popper as scheduled in October.  2. Obesity, Class III, BMI 40-49.9 (morbid obesity) - encouraged adopting some simple changes that could result in 15-20 pound weight loss; pt is adamant that attempting to reach 200 ponds would not be "healthy" for her- she feels most comfortable at 220 lbs. [facility] with a trainer to design a fitness program suitable for her limitations.

## 2012-04-23 ENCOUNTER — Ambulatory Visit: Payer: Medicare Other | Admitting: Cardiovascular Disease

## 2012-06-02 ENCOUNTER — Other Ambulatory Visit: Payer: Self-pay | Admitting: *Deleted

## 2012-06-02 DIAGNOSIS — I10 Essential (primary) hypertension: Secondary | ICD-10-CM

## 2012-06-02 MED ORDER — CLONIDINE HCL 0.1 MG PO TABS: 0.1000 mg | ORAL_TABLET | Freq: Two times a day (BID) | ORAL | Status: DC

## 2012-06-02 NOTE — Telephone Encounter (Signed)
Fax Received. Refill Completed. Nicole Abbott (R.M.A)   

## 2012-06-18 ENCOUNTER — Ambulatory Visit
Admission: RE | Admit: 2012-06-18 | Discharge: 2012-06-18 | Disposition: A | Discharge status: Home / Self Care | Payer: Medicare Other | Source: Ambulatory Visit | Attending: Podiatry | Admitting: Podiatry

## 2012-06-18 ENCOUNTER — Other Ambulatory Visit: Payer: Self-pay | Admitting: Podiatry

## 2012-06-18 DIAGNOSIS — M25572 Pain in left ankle and joints of left foot: Secondary | ICD-10-CM

## 2012-06-24 ENCOUNTER — Other Ambulatory Visit: Payer: Self-pay | Admitting: *Deleted

## 2012-06-24 MED ORDER — POTASSIUM CHLORIDE CRYS ER 10 MEQ PO TBCR: 10.0000 meq | EXTENDED_RELEASE_TABLET | Freq: Every day | ORAL | Status: DC

## 2012-07-02 ENCOUNTER — Encounter: Payer: Self-pay | Admitting: Family Medicine

## 2012-07-02 ENCOUNTER — Ambulatory Visit (INDEPENDENT_AMBULATORY_CARE_PROVIDER_SITE_OTHER): Payer: Medicare Other | Admitting: Family Medicine

## 2012-07-02 VITALS — BP 201/89 | HR 89 | Temp 98.4°F | Resp 20

## 2012-07-02 DIAGNOSIS — S86019A Strain of unspecified Achilles tendon, initial encounter: Secondary | ICD-10-CM

## 2012-07-02 DIAGNOSIS — I10 Essential (primary) hypertension: Secondary | ICD-10-CM

## 2012-07-02 DIAGNOSIS — S96819A Strain of other specified muscles and tendons at ankle and foot level, unspecified foot, initial encounter: Secondary | ICD-10-CM

## 2012-07-02 DIAGNOSIS — S93499A Sprain of other ligament of unspecified ankle, initial encounter: Secondary | ICD-10-CM

## 2012-07-02 DIAGNOSIS — Z760 Encounter for issue of repeat prescription: Secondary | ICD-10-CM

## 2012-07-02 MED ORDER — LISINOPRIL 40 MG PO TABS: 40.0000 mg | ORAL_TABLET | Freq: Every day | ORAL | Status: DC

## 2012-07-02 NOTE — Patient Instructions (Addendum)
Complete Achilles Tendon Rupture Tendons are the tough, fibrous, and stretchy (elastic) tissues that connect muscle to bone. The Achilles tendon is the large cord-like structure (tendon) in the back of the leg just above the foot. It attaches the large muscles of the lower leg to the heel bone. You can feel this as the large cord just above the heel. The diagnosis of complete Achilles tendon tear (rupture) is made by examination. You are not able to stand up on the toes of the injured side with this injury. X-rays will determine the extent of the injury. Surgical repair with casting is necessary with complete rupture of the tendon. Surgery allows the surgeon to put the tendon back together. The cast is used to allow the repair time to heal. The injury may be casted or immobilized for 6 to 10 weeks. Immobilization means that the tendon injured is kept in position with a cast or splint. Once your caregiver feels you have healed well enough, he or she will provide exercises you can do to make the injured tendon feel better (rehabilitate). HOME CARE INSTRUCTIONS   Apply ice to the injury for 15 to 20 minutes, 3 to 4 times per day. Put the ice in a plastic bag and place a towel between the bag of ice and your skin, splint, or immobilization device.  Use crutches and move about only as instructed.  Keep the leg elevated above the level of the heart (the center of the chest) at all times when not using the bathroom. Do not dangle the leg over a chair, couch, or bed. When lying down, elevate your leg on a few pillows. Elevation prevents swelling and reduces pain.  Avoid use other than gentle range of motion of the toes.  Do not drive a car until your caregiver specifically tells you it is safe to do so.  Only take over-the-counter or prescription medicines for pain, discomfort, or fever as directed by your caregiver.  If your caregiver has given you a follow-up appointment, it is very important to keep that  appointment. Not keeping the appointment could result in a chronic or permanent injury, pain, and disability. If there is any problem keeping the appointment, you must call back to this facility for assistance. SEEK MEDICAL CARE IF:   Your pain and swelling increase, or pain is uncontrolled with medications.  You develop new unexplained problems (symptoms) or an increase of the symptoms that brought you to your caregiver.  You develop an inability to move your toes or foot, develop warmth and swelling in your foot, or begin running an unexplained fever. MAKE SURE YOU:   Understand these instructions.  Will watch your condition.  Will get help right away if you are not doing well or get worse. Document Released: 03/26/2005 Document Revised: 09/08/2011 Document Reviewed: 01/18/2008 [facility] Patient Information 2013 [place], [place].

## 2012-07-04 ENCOUNTER — Encounter: Payer: Self-pay | Admitting: Family Medicine

## 2012-07-04 NOTE — Progress Notes (Signed)
S: This 67 y.o. AA female returns today reporting Achilles tendon rupture, diagnosed by physical therapist. She was first evaluated for ankle pain in August 2013; xray at that time was negative. She was seen by a podiatrist in Sept. or Oct. who injected the Achilles tendon area. Pt thinks she injured her ankle at some point, recalling hearing a loud "pop" and felt weakness in the left ankle/foot.  After little improvement, pt sought opinion of second podiatrist who referred her for PT. The physical therapist detected an abnormality and discontinued treatment, referring pt back to podiatrist for definitive treatment of tendon rupture. This was confirmed on MRI and casting was recommended; a CAM walker was applied but pt had to remove it after 48 hours because of pain and swelling. The pt was not in complete agreement w/ the plan of treatment and comes today for evaluation and referral.   Re: HTN- pt checks BP at home and is quite comfortable w/ readings of 140-155/75-90. She is asymptomatic, denying CP or tightness, palpitations. HA, dizziness, weakness, numbness or syncope.  ROS: As per HPI.  O:  Filed Vitals:   07/02/12 1603  BP: 201/89  Pulse: 89  Temp: 98.4 F (36.9 C)  Resp: 20   GEN: In NAD; WN,WD. HENT: [place]/AT. EOMI w/ clear conj/ scl. Otherwise unremarkable. COR: RRR. LUNGS: Normal resp rate and effort. MS: Left ankle- decreased ROM (no flexion or extension) w/ tenderness over Achilles tendon. NEURO: A&O x 3; CNs intact. Nonfocal.  A/P:  1. Ruptured, tendon, Achilles  Ambulatory referral to Orthopedic Surgery  2. HTN (hypertension)  Continue home monitoring No medication changes at this time( pt is not inclined to agree to any medication changes at this visit).  3. Medication refill

## 2012-07-15 ENCOUNTER — Other Ambulatory Visit: Payer: Self-pay | Admitting: Orthopedic Surgery

## 2012-07-16 ENCOUNTER — Encounter (HOSPITAL_BASED_OUTPATIENT_CLINIC_OR_DEPARTMENT_OTHER): Payer: Self-pay | Admitting: *Deleted

## 2012-07-16 NOTE — Progress Notes (Signed)
Pt sees dr Melburn Popper for htn control-he did want her to have a sleep study, pt denies snoring or apneic periods-will come in for bmet-has ekg 5/13 Echo 2/13 was good.

## 2012-07-19 ENCOUNTER — Encounter (HOSPITAL_BASED_OUTPATIENT_CLINIC_OR_DEPARTMENT_OTHER)
Admission: RE | Admit: 2012-07-19 | Discharge: 2012-07-19 | Disposition: A | Discharge status: Home / Self Care | Payer: Medicare Other | Source: Ambulatory Visit | Attending: Orthopedic Surgery | Admitting: Orthopedic Surgery

## 2012-07-19 LAB — BASIC METABOLIC PANEL
CO2: 26 mEq/L (ref 19–32)
Chloride: 99 mEq/L (ref 96–112)
Glucose, Bld: 116 mg/dL — ABNORMAL HIGH (ref 70–99)
Potassium: 3.8 mEq/L (ref 3.5–5.1)
Sodium: 137 mEq/L (ref 135–145)

## 2012-07-21 ENCOUNTER — Ambulatory Visit (HOSPITAL_BASED_OUTPATIENT_CLINIC_OR_DEPARTMENT_OTHER)
Admission: RE | Admit: 2012-07-21 | Discharge: 2012-07-22 | Disposition: A | Discharge status: Home / Self Care | Payer: Medicare Other | Source: Ambulatory Visit | Attending: Orthopedic Surgery | Admitting: Orthopedic Surgery

## 2012-07-21 ENCOUNTER — Encounter (HOSPITAL_BASED_OUTPATIENT_CLINIC_OR_DEPARTMENT_OTHER): Payer: Self-pay | Admitting: Anesthesiology

## 2012-07-21 ENCOUNTER — Encounter (HOSPITAL_BASED_OUTPATIENT_CLINIC_OR_DEPARTMENT_OTHER)
Admission: RE | Disposition: A | Discharge status: Home / Self Care | Payer: Self-pay | Source: Ambulatory Visit | Attending: Orthopedic Surgery

## 2012-07-21 ENCOUNTER — Ambulatory Visit (HOSPITAL_BASED_OUTPATIENT_CLINIC_OR_DEPARTMENT_OTHER): Payer: Medicare Other | Admitting: Anesthesiology

## 2012-07-21 ENCOUNTER — Encounter (HOSPITAL_BASED_OUTPATIENT_CLINIC_OR_DEPARTMENT_OTHER): Payer: Self-pay | Admitting: *Deleted

## 2012-07-21 DIAGNOSIS — X58XXXA Exposure to other specified factors, initial encounter: Secondary | ICD-10-CM | POA: Insufficient documentation

## 2012-07-21 DIAGNOSIS — S93499A Sprain of other ligament of unspecified ankle, initial encounter: Secondary | ICD-10-CM | POA: Insufficient documentation

## 2012-07-21 DIAGNOSIS — I10 Essential (primary) hypertension: Secondary | ICD-10-CM | POA: Insufficient documentation

## 2012-07-21 DIAGNOSIS — S96819A Strain of other specified muscles and tendons at ankle and foot level, unspecified foot, initial encounter: Secondary | ICD-10-CM | POA: Insufficient documentation

## 2012-07-21 DIAGNOSIS — Z01812 Encounter for preprocedural laboratory examination: Secondary | ICD-10-CM | POA: Insufficient documentation

## 2012-07-21 DIAGNOSIS — S86012A Strain of left Achilles tendon, initial encounter: Secondary | ICD-10-CM

## 2012-07-21 HISTORY — DX: Other constipation: K59.09

## 2012-07-21 HISTORY — DX: Presence of spectacles and contact lenses: Z97.3

## 2012-07-21 HISTORY — PX: ACHILLES TENDON SURGERY: SHX542

## 2012-07-21 HISTORY — DX: Gastro-esophageal reflux disease without esophagitis: K21.9

## 2012-07-21 LAB — CBC WITH DIFFERENTIAL/PLATELET
Basophils Absolute: 0.1 10*3/uL (ref 0.0–0.1)
Basophils Relative: 1 % (ref 0–1)
Eosinophils Absolute: 0 10*3/uL (ref 0.0–0.7)
MCHC: 34.7 g/dL (ref 30.0–36.0)
Neutro Abs: 9.8 10*3/uL — ABNORMAL HIGH (ref 1.7–7.7)
Neutrophils Relative %: 83 % — ABNORMAL HIGH (ref 43–77)
RDW: 14.8 % (ref 11.5–15.5)

## 2012-07-21 LAB — SEDIMENTATION RATE: Sed Rate: 34 mm/hr — ABNORMAL HIGH (ref 0–22)

## 2012-07-21 SURGERY — REPAIR, TENDON, ACHILLES
Anesthesia: Regional | Site: Ankle | Laterality: Left | Wound class: Clean

## 2012-07-21 MED ORDER — ONDANSETRON HCL 4 MG/2ML IJ SOLN: 4.0000 mg | Freq: Once | INTRAMUSCULAR | Status: AC | PRN

## 2012-07-21 MED ORDER — SUCCINYLCHOLINE CHLORIDE 20 MG/ML IJ SOLN
INTRAMUSCULAR | Status: DC | PRN
  Administered 2012-07-21: 100 mg via INTRAVENOUS

## 2012-07-21 MED ORDER — LIDOCAINE-EPINEPHRINE (PF) 1.5 %-1:200000 IJ SOLN
INTRAMUSCULAR | Status: DC | PRN
  Administered 2012-07-21: 30 mL

## 2012-07-21 MED ORDER — ZOLPIDEM TARTRATE 5 MG PO TABS: 5.0000 mg | ORAL_TABLET | Freq: Every evening | ORAL | Status: DC | PRN

## 2012-07-21 MED ORDER — OXYCODONE HCL 5 MG/5ML PO SOLN: 5.0000 mg | Freq: Once | ORAL | Status: AC | PRN

## 2012-07-21 MED ORDER — CLINDAMYCIN PHOSPHATE 900 MG/50ML IV SOLN
900.0000 mg | INTRAVENOUS | Status: AC
  Administered 2012-07-21: 900 mg via INTRAVENOUS

## 2012-07-21 MED ORDER — MIDAZOLAM HCL 2 MG/ML PO SYRP: 0.5000 mg/kg | ORAL_SOLUTION | Freq: Once | ORAL | Status: DC | PRN

## 2012-07-21 MED ORDER — LACTATED RINGERS IV SOLN
INTRAVENOUS | Status: DC
  Administered 2012-07-21 (×2): via INTRAVENOUS
  Administered 2012-07-21: 20 mL/h via INTRAVENOUS

## 2012-07-21 MED ORDER — LIDOCAINE HCL 4 % MT SOLN
OROMUCOSAL | Status: DC | PRN
  Administered 2012-07-21: 4 mL via TOPICAL

## 2012-07-21 MED ORDER — SODIUM CHLORIDE 0.9 % IV SOLN
INTRAVENOUS | Status: DC
  Administered 2012-07-21: 17:00:00 via INTRAVENOUS

## 2012-07-21 MED ORDER — ONDANSETRON HCL 4 MG/2ML IJ SOLN
INTRAMUSCULAR | Status: DC | PRN
  Administered 2012-07-21: 4 mg via INTRAVENOUS

## 2012-07-21 MED ORDER — DEXAMETHASONE SODIUM PHOSPHATE 4 MG/ML IJ SOLN
INTRAMUSCULAR | Status: DC | PRN
  Administered 2012-07-21: 10 mg via INTRAVENOUS

## 2012-07-21 MED ORDER — ASPIRIN EC 325 MG PO TBEC: 325.0000 mg | DELAYED_RELEASE_TABLET | Freq: Two times a day (BID) | ORAL | Status: DC

## 2012-07-21 MED ORDER — OXYCODONE-ACETAMINOPHEN 5-325 MG PO TABS
1.0000 | ORAL_TABLET | ORAL | Status: DC | PRN
  Administered 2012-07-21 – 2012-07-22 (×3): 2 via ORAL

## 2012-07-21 MED ORDER — METHOCARBAMOL 500 MG PO TABS
500.0000 mg | ORAL_TABLET | Freq: Four times a day (QID) | ORAL | Status: DC | PRN
  Administered 2012-07-21 – 2012-07-22 (×2): 500 mg via ORAL

## 2012-07-21 MED ORDER — FAMOTIDINE 20 MG PO TABS: 20.0000 mg | ORAL_TABLET | Freq: Two times a day (BID) | ORAL | Status: DC

## 2012-07-21 MED ORDER — METHOCARBAMOL 100 MG/ML IJ SOLN: 500.0000 mg | Freq: Four times a day (QID) | INTRAVENOUS | Status: DC | PRN

## 2012-07-21 MED ORDER — LIDOCAINE HCL (CARDIAC) 20 MG/ML IV SOLN
INTRAVENOUS | Status: DC | PRN
  Administered 2012-07-21: 100 mg via INTRAVENOUS

## 2012-07-21 MED ORDER — BUPIVACAINE-EPINEPHRINE PF 0.5-1:200000 % IJ SOLN
INTRAMUSCULAR | Status: DC | PRN
  Administered 2012-07-21: 30 mL

## 2012-07-21 MED ORDER — ASPIRIN 325 MG PO TBEC: 325.0000 mg | DELAYED_RELEASE_TABLET | Freq: Two times a day (BID) | ORAL | Status: DC

## 2012-07-21 MED ORDER — SODIUM CHLORIDE 0.9 % IR SOLN
Status: DC | PRN
  Administered 2012-07-21: 3000 mL

## 2012-07-21 MED ORDER — ONDANSETRON HCL 4 MG PO TABS: 4.0000 mg | ORAL_TABLET | Freq: Four times a day (QID) | ORAL | Status: DC | PRN

## 2012-07-21 MED ORDER — MEPERIDINE HCL 25 MG/ML IJ SOLN: 6.2500 mg | INTRAMUSCULAR | Status: DC | PRN

## 2012-07-21 MED ORDER — CLONIDINE HCL 0.1 MG PO TABS: 0.1000 mg | ORAL_TABLET | Freq: Two times a day (BID) | ORAL | Status: DC

## 2012-07-21 MED ORDER — POVIDONE-IODINE 7.5 % EX SOLN: Freq: Once | CUTANEOUS | Status: DC

## 2012-07-21 MED ORDER — ONDANSETRON HCL 4 MG/2ML IJ SOLN: 4.0000 mg | Freq: Four times a day (QID) | INTRAMUSCULAR | Status: DC | PRN

## 2012-07-21 MED ORDER — PROPOFOL 10 MG/ML IV BOLUS
INTRAVENOUS | Status: DC | PRN
  Administered 2012-07-21: 130 mg via INTRAVENOUS
  Administered 2012-07-21: 70 mg via INTRAVENOUS

## 2012-07-21 MED ORDER — HYDROCHLOROTHIAZIDE 25 MG PO TABS: 25.0000 mg | ORAL_TABLET | Freq: Every day | ORAL | Status: DC

## 2012-07-21 MED ORDER — MIDAZOLAM HCL 2 MG/2ML IJ SOLN
1.0000 mg | INTRAMUSCULAR | Status: DC | PRN
  Administered 2012-07-21: 2 mg via INTRAVENOUS

## 2012-07-21 MED ORDER — METOCLOPRAMIDE HCL 5 MG/ML IJ SOLN: 5.0000 mg | Freq: Three times a day (TID) | INTRAMUSCULAR | Status: DC | PRN

## 2012-07-21 MED ORDER — SULFAMETHOXAZOLE-TRIMETHOPRIM 800-160 MG PO TABS: 1.0000 | ORAL_TABLET | Freq: Two times a day (BID) | ORAL | Status: DC

## 2012-07-21 MED ORDER — HYDROMORPHONE HCL PF 1 MG/ML IJ SOLN
1.0000 mg | INTRAMUSCULAR | Status: DC | PRN
  Administered 2012-07-22 (×2): 1 mg via INTRAVENOUS

## 2012-07-21 MED ORDER — OXYCODONE HCL 5 MG PO TABS: 5.0000 mg | ORAL_TABLET | Freq: Once | ORAL | Status: AC | PRN

## 2012-07-21 MED ORDER — OXYCODONE-ACETAMINOPHEN 5-325 MG PO TABS: 1.0000 | ORAL_TABLET | Freq: Four times a day (QID) | ORAL | Status: DC | PRN

## 2012-07-21 MED ORDER — FENTANYL CITRATE 0.05 MG/ML IJ SOLN
50.0000 ug | INTRAMUSCULAR | Status: DC | PRN
  Administered 2012-07-21 (×2): 100 ug via INTRAVENOUS

## 2012-07-21 MED ORDER — LISINOPRIL 40 MG PO TABS: 40.0000 mg | ORAL_TABLET | Freq: Every day | ORAL | Status: DC

## 2012-07-21 MED ORDER — METOCLOPRAMIDE HCL 5 MG PO TABS: 5.0000 mg | ORAL_TABLET | Freq: Three times a day (TID) | ORAL | Status: DC | PRN

## 2012-07-21 MED ORDER — CLINDAMYCIN PHOSPHATE 600 MG/50ML IV SOLN
600.0000 mg | Freq: Four times a day (QID) | INTRAVENOUS | Status: AC
  Administered 2012-07-21 – 2012-07-22 (×3): 600 mg via INTRAVENOUS

## 2012-07-21 MED ORDER — HYDROMORPHONE HCL PF 1 MG/ML IJ SOLN
0.2500 mg | INTRAMUSCULAR | Status: DC | PRN
  Administered 2012-07-21 (×3): 0.5 mg via INTRAVENOUS

## 2012-07-21 MED ORDER — FENTANYL CITRATE 0.05 MG/ML IJ SOLN
INTRAMUSCULAR | Status: DC | PRN
  Administered 2012-07-21: 100 ug via INTRAVENOUS

## 2012-07-21 SURGICAL SUPPLY — 69 items
BANDAGE ELASTIC 4 VELCRO ST LF (GAUZE/BANDAGES/DRESSINGS) ×2 IMPLANT
BANDAGE ELASTIC 6 VELCRO ST LF (GAUZE/BANDAGES/DRESSINGS) ×2 IMPLANT
BANDAGE ESMARK 6X9 LF (GAUZE/BANDAGES/DRESSINGS) ×1 IMPLANT
BIT DRILL 3/32DIAX5INL DISPOSE (BIT) ×1 IMPLANT
BIT DRILL 3/32DX5IN DISP (BIT) ×1
BLADE SURG 10 STRL SS (BLADE) ×2 IMPLANT
BLADE SURG 15 STRL LF DISP TIS (BLADE) ×2 IMPLANT
BLADE SURG 15 STRL SS (BLADE) ×2
BNDG ESMARK 4X9 LF (GAUZE/BANDAGES/DRESSINGS) IMPLANT
BNDG ESMARK 6X9 LF (GAUZE/BANDAGES/DRESSINGS) ×2
CANISTER OMNI JUG 16 LITER (MISCELLANEOUS) ×2 IMPLANT
CLOTH BEACON ORANGE TIMEOUT ST (SAFETY) ×2 IMPLANT
COVER TABLE BACK 60X90 (DRAPES) ×2 IMPLANT
DECANTER SPIKE VIAL GLASS SM (MISCELLANEOUS) IMPLANT
DRAPE EXTREMITY T 121X128X90 (DRAPE) ×2 IMPLANT
DRAPE U 20/CS (DRAPES) ×2 IMPLANT
DRAPE U-SHAPE 47X51 STRL (DRAPES) ×2 IMPLANT
DRILL BIT 3/32DIAX5INL DISPOSE (BIT) ×1
DURAPREP 26ML APPLICATOR (WOUND CARE) ×2 IMPLANT
ELECT REM PT RETURN 9FT ADLT (ELECTROSURGICAL) ×2
ELECTRODE REM PT RTRN 9FT ADLT (ELECTROSURGICAL) ×1 IMPLANT
GAUZE XEROFORM 1X8 LF (GAUZE/BANDAGES/DRESSINGS) ×2 IMPLANT
GLOVE BIOGEL PI IND STRL 8 (GLOVE) ×2 IMPLANT
GLOVE BIOGEL PI INDICATOR 8 (GLOVE) ×2
GLOVE ECLIPSE 6.5 STRL STRAW (GLOVE) ×4 IMPLANT
GLOVE ECLIPSE 7.5 STRL STRAW (GLOVE) ×4 IMPLANT
GLOVE INDICATOR 7.0 STRL GRN (GLOVE) ×4 IMPLANT
GOWN BRE IMP PREV XXLGXLNG (GOWN DISPOSABLE) ×2 IMPLANT
GOWN PREVENTION PLUS XLARGE (GOWN DISPOSABLE) ×4 IMPLANT
GOWN PREVENTION PLUS XXLARGE (GOWN DISPOSABLE) ×2 IMPLANT
HANDPIECE INTERPULSE COAX TIP (DISPOSABLE) ×1
NDL SUT 6 .5 CRC .975X.05 MAYO (NEEDLE) ×1 IMPLANT
NEEDLE HYPO 25X1 1.5 SAFETY (NEEDLE) IMPLANT
NEEDLE MAYO TAPER (NEEDLE) ×1
NS IRRIG 1000ML POUR BTL (IV SOLUTION) ×2 IMPLANT
PAD CAST 4YDX4 CTTN HI CHSV (CAST SUPPLIES) ×2 IMPLANT
PADDING CAST ABS 4INX4YD NS (CAST SUPPLIES) ×1
PADDING CAST ABS COTTON 4X4 ST (CAST SUPPLIES) ×1 IMPLANT
PADDING CAST COTTON 4X4 STRL (CAST SUPPLIES) ×2
PASSER SUT SWANSON 36MM LOOP (INSTRUMENTS) ×2 IMPLANT
PENCIL BUTTON HOLSTER BLD 10FT (ELECTRODE) ×2 IMPLANT
SET HNDPC FAN SPRY TIP SCT (DISPOSABLE) ×1 IMPLANT
SPLINT FAST PLASTER 5X30 (CAST SUPPLIES) ×2
SPLINT PLASTER CAST FAST 5X30 (CAST SUPPLIES) ×2 IMPLANT
SPONGE GAUZE 4X4 12PLY (GAUZE/BANDAGES/DRESSINGS) ×2 IMPLANT
SPONGE LAP 4X18 X RAY DECT (DISPOSABLE) ×2 IMPLANT
STOCKINETTE 6  STRL (DRAPES) ×1
STOCKINETTE 6 STRL (DRAPES) ×1 IMPLANT
SUCTION FRAZIER TIP 10 FR DISP (SUCTIONS) IMPLANT
SUT ETHIBOND 0 MO6 C/R (SUTURE) IMPLANT
SUT ETHIBOND 2 OS 4 DA (SUTURE) ×8 IMPLANT
SUT ETHILON 3 0 PS 1 (SUTURE) ×2 IMPLANT
SUT FIBERWIRE #2 38 T-5 BLUE (SUTURE) ×2
SUT FIBERWIRE #5 38 CONV NDL (SUTURE) ×6
SUT VIC AB 0 CT1 27 (SUTURE) ×1
SUT VIC AB 0 CT1 27XBRD ANBCTR (SUTURE) ×1 IMPLANT
SUT VIC AB 2-0 CT1 27 (SUTURE) ×2
SUT VIC AB 2-0 CT1 TAPERPNT 27 (SUTURE) ×2 IMPLANT
SUT VIC AB 3-0 FS2 27 (SUTURE) IMPLANT
SUTURE FIBERWR #2 38 T-5 BLUE (SUTURE) ×1 IMPLANT
SUTURE FIBERWR #5 38 CONV NDL (SUTURE) ×3 IMPLANT
SYR BULB 3OZ (MISCELLANEOUS) ×2 IMPLANT
SYR CONTROL 10ML LL (SYRINGE) IMPLANT
TOWEL OR 17X24 6PK STRL BLUE (TOWEL DISPOSABLE) ×6 IMPLANT
TOWEL OR NON WOVEN STRL DISP B (DISPOSABLE) ×2 IMPLANT
TUBE CONNECTING 20X1/4 (TUBING) ×4 IMPLANT
UNDERPAD 30X30 INCONTINENT (UNDERPADS AND DIAPERS) ×2 IMPLANT
WATER STERILE IRR 1000ML POUR (IV SOLUTION) ×2 IMPLANT
YANKAUER SUCT BULB TIP NO VENT (SUCTIONS) IMPLANT

## 2012-07-21 NOTE — Brief Op Note (Signed)
07/21/2012  3:06 PM  PATIENT:  Nicole Abbott  67 y.o. female  PRE-OPERATIVE DIAGNOSIS:  LEFT ACHILLES TENDON RUPTURE  POST-OPERATIVE DIAGNOSIS:  LEFT ACHILLES TENDON RUPTURE  PROCEDURE:  Procedure(s) (LRB) with comments: ACHILLES TENDON REPAIR (Left) -  haglun and gastroc resection  SURGEON:  Surgeon(s) and Role:    * Harvie Junior, MD - Primary  PHYSICIAN ASSISTANT:   ASSISTANTS: bethune   ANESTHESIA:   general  EBL:  Total I/O In: 1800 [I.V.:1800] Out: -   BLOOD ADMINISTERED:none  DRAINS: none   LOCAL MEDICATIONS USED:  NONE  SPECIMEN:  No Specimen  DISPOSITION OF SPECIMEN:  N/A  COUNTS:  YES  TOURNIQUET:   Total Tourniquet Time Documented: Thigh (Left) - 118 minutes  DICTATION: .Other Dictation: Dictation Number [id number]  PLAN OF CARE: Admit for overnight observation  PATIENT DISPOSITION:  PACU - hemodynamically stable.   Delay start of Pharmacological VTE agent (>24hrs) due to surgical blood loss or risk of bleeding: no

## 2012-07-21 NOTE — H&P (Signed)
PREOPERATIVE H&P  Chief Complaint: achilles pain and weakness  HPI: Nicole Abbott is a 67 y.o. female who presents for evaluation of l achilles pain and weakness. It has been present for greater than 3 months and has been worsening. She has failed conservative measures. Pain is rated as moderate.  Past Medical History  Diagnosis Date  . Hypertension   . Arthritis   . Asthma     as child  . Chronic constipation   . GERD (gastroesophageal reflux disease)   . Wears glasses    Past Surgical History  Procedure Date  . Tubal ligation   . Cystocele repair   . Colonoscopy   . Dilation and curettage of uterus 32   History   Social History  . Marital Status: Divorced    Spouse Name: N/A    Number of Children: N/A  . Years of Education: N/A   Social History Main Topics  . Smoking status: Former Smoker    Quit date: 06/30/1984  . Smokeless tobacco: None  . Alcohol Use: Yes     Comment: Occasional wine drinker  . Drug Use: No  . Sexually Active: Not Currently    Birth Control/ Protection: Abstinence   Other Topics Concern  . None   Social History Narrative  . None   Family History  Problem Relation Age of Onset  . Diabetes Mother   . Hypertension Mother   . Diabetes Sister   . Hypertension Sister   . Thyroid disease Sister   . Thyroid disease Brother   . Asthma Son   . Asthma Grandchild   . Asthma Grandchild   . Asthma Grandchild    Allergies  Allergen Reactions  . Penicillins Anaphylaxis  . Codeine     Causes ileus   Prior to Admission medications   Medication Sig Start Date End Date Taking? Authorizing Provider  ranitidine (ZANTAC) 150 MG tablet Take 150 mg by mouth 2 (two) times daily.   Yes Historical Provider, MD  aspirin 81 MG tablet Take 81 mg by mouth every morning. 08/19/11 08/18/12  Vassie Loll, MD  cloNIDine (CATAPRES) 0.1 MG tablet Take 1 tablet (0.1 mg total) by mouth 2 (two) times daily. 06/02/12 06/02/13  Vesta Mixer, MD  Coenzyme Q10  (CO Q 10) 100 MG CAPS Take 1 tablet by mouth daily.    Historical Provider, MD  docusate-casanthranol (PERICOLACE) 100-30 MG per capsule Take 3 capsules by mouth daily.     Historical Provider, MD  hydrochlorothiazide (HYDRODIURIL) 25 MG tablet Take 1 tablet (25 mg total) by mouth daily. 03/04/12 03/04/13  Maurice March, MD  lisinopril (PRINIVIL,ZESTRIL) 40 MG tablet Take 1 tablet (40 mg total) by mouth daily. 07/02/12   Maurice March, MD  magnesium oxide (MAG-OX) 400 MG tablet Take 400 mg by mouth at bedtime.    Historical Provider, MD  mometasone (NASONEX) 50 MCG/ACT nasal spray Place 2 sprays into the nose as needed. 11/18/11 11/17/12  Dois Davenport, MD  Multiple Vitamins-Minerals (MULTIVITAMIN WITH MINERALS) tablet Take 1 tablet by mouth daily.    Historical Provider, MD  NAPROXEN SODIUM PO Take by mouth daily.    Historical Provider, MD  Polyethyl Glycol-Propyl Glycol (SYSTANE OP) Apply to eye 2 (two) times daily.    Historical Provider, MD  potassium chloride (K-DUR,KLOR-CON) 10 MEQ tablet Take 1 tablet (10 mEq total) by mouth daily. 06/24/12 06/24/13  Vesta Mixer, MD     Positive ROS: none  All other systems  have been reviewed and were otherwise negative with the exception of those mentioned in the HPI and as above.  Physical Exam: There were no vitals filed for this visit.  General: Alert, no acute distress Cardiovascular: No pedal edema Respiratory: No cyanosis, no use of accessory musculature GI: No organomegaly, abdomen is soft and non-tender Skin: No lesions in the area of chief complaint Neurologic: Sensation intact distally Psychiatric: Patient is competent for consent with normal mood and affect Lymphatic: No axillary or cervical lymphadenopathy  MUSCULOSKELETAL: palp defect over heel area.  Weak push off  MRI +achilles rupture off heel   Assessment/Plan: LEFT ACHILLES TENDON RUPTURE Plan for Procedure(s): ACHILLES TENDON REPAIR with possible gastroc  recession  The risks benefits and alternatives were discussed with the patient including but not limited to the risks of nonoperative treatment, versus surgical intervention including infection, bleeding, nerve injury, malunion, nonunion, hardware prominence, hardware failure, need for hardware removal, blood clots, cardiopulmonary complications, morbidity, mortality, among others, and they were willing to proceed.  Predicted outcome is good, although there will be at least a six to nine month expected recovery.  Geremy Rister L, MD 07/21/2012 10:11 AM

## 2012-07-21 NOTE — Progress Notes (Signed)
Assisted Dr. Ossey with left, ultrasound guided, popliteal/saphenous block. Side rails up, monitors on throughout procedure. See vital signs in flow sheet. Tolerated Procedure well. 

## 2012-07-21 NOTE — Anesthesia Preprocedure Evaluation (Addendum)
Anesthesia Evaluation  Patient identified by MRN, date of birth, ID band Patient awake    Reviewed: Allergy & Precautions, H&P , NPO status , Patient's Chart, lab work & pertinent test results  Airway Mallampati: I TM Distance: >3 FB Neck ROM: Full    Dental   Pulmonary asthma ,          Cardiovascular hypertension, Pt. on medications     Neuro/Psych    GI/Hepatic GERD-  Medicated and Controlled,  Endo/Other    Renal/GU      Musculoskeletal   Abdominal   Peds  Hematology   Anesthesia Other Findings   Reproductive/Obstetrics                           Anesthesia Physical Anesthesia Plan  ASA: II  Anesthesia Plan: General   Post-op Pain Management:    Induction: Intravenous  Airway Management Planned: Oral ETT  Additional Equipment:   Intra-op Plan:   Post-operative Plan: Extubation in OR  Informed Consent: I have reviewed the patients History and Physical, chart, labs and discussed the procedure including the risks, benefits and alternatives for the proposed anesthesia with the patient or authorized representative who has indicated his/her understanding and acceptance.     Plan Discussed with: CRNA and Surgeon  Anesthesia Plan Comments:         Anesthesia Quick Evaluation  

## 2012-07-21 NOTE — Anesthesia Procedure Notes (Addendum)
Anesthesia Regional Block:  Popliteal block  Pre-Anesthetic Checklist: ,, timeout performed, Correct Patient, Correct Site, Correct Laterality, Correct Procedure, Correct Position, site marked, Risks and benefits discussed,  Surgical consent,  Pre-op evaluation,  At surgeon's request and post-op pain management  Laterality: Left  Prep: chloraprep       Needles:  Injection technique: Single-shot  Needle Type: Echogenic Stimulator Needle     Needle Length: 10cm 10 cm     Additional Needles:  Procedures: ultrasound guided (picture in chart) and nerve stimulator Popliteal block  Nerve Stimulator or Paresthesia:  Response: 0.4 mA,   Additional Responses:   Narrative:  Start time: 07/21/2012 11:15 AM End time: 07/21/2012 11:35 AM Injection made incrementally with aspirations every 5 mL.  Performed by: Personally  Anesthesiologist: Arta Bruce MD  Additional Notes: Monitors applied. Patient sedated. Sterile prep and drape,hand hygiene and sterile gloves were used. Relevant anatomy identified.Needle position confirmed.Local anesthetic injected incrementally after negative aspiration. Local anesthetic spread visualized around nerve(s). Vascular puncture avoided. No complications. Image printed for medical record.The patient tolerated the procedure well.  Additional Saphenous nerve block performed. 15cc Local Anesthetic mixture placed under ultrasonic guidance along the medio-inferior border of the Sartorious muscle 6 inches above the knee.  No Problems encountered.  Arta Bruce MD   Popliteal block Procedure Name: Intubation Date/Time: 07/21/2012 11:56 AM Performed by: Burna Cash Pre-anesthesia Checklist: Patient identified, Emergency Drugs available, Suction available and Patient being monitored Patient Re-evaluated:Patient Re-evaluated prior to inductionOxygen Delivery Method: Circle System Utilized Preoxygenation: Pre-oxygenation with 100% oxygen Intubation Type: IV  induction Ventilation: Mask ventilation without difficulty Laryngoscope Size: Mac and 3 Grade View: Grade I Tube type: Oral Tube size: 7.0 mm Number of attempts: 1 Airway Equipment and Method: stylet and oral airway Placement Confirmation: ETT inserted through vocal cords under direct vision,  positive ETCO2 and breath sounds checked- equal and bilateral Secured at: 21 cm Tube secured with: Tape Dental Injury: Teeth and Oropharynx as per pre-operative assessment

## 2012-07-21 NOTE — Transfer of Care (Signed)
Immediate Anesthesia Transfer of Care Note  Patient: Nicole Abbott  Procedure(s) Performed: Procedure(s) (LRB) with comments: ACHILLES TENDON REPAIR (Left) -  haglun and gastroc resection  Patient Location: PACU  Anesthesia Type:GA combined with regional for post-op pain  Level of Consciousness: sedated  Airway & Oxygen Therapy: Patient Spontanous Breathing and Patient connected to face mask oxygen  Post-op Assessment: Report given to PACU RN and Post -op Vital signs reviewed and stable  Post vital signs: Reviewed and stable  Complications: No apparent anesthesia complications

## 2012-07-21 NOTE — Anesthesia Postprocedure Evaluation (Signed)
Anesthesia Post Note  Patient: Nicole Abbott  Procedure(s) Performed: Procedure(s) (LRB): ACHILLES TENDON REPAIR (Left)  Anesthesia type: general  Patient location: PACU  Post pain: Pain level controlled  Post assessment: Patient's Cardiovascular Status Stable  Last Vitals:  Filed Vitals:   07/21/12 1615  BP: 134/55  Pulse:   Temp:   Resp:     Post vital signs: Reviewed and stable  Level of consciousness: sedated  Complications: No apparent anesthesia complications

## 2012-07-22 ENCOUNTER — Encounter (HOSPITAL_BASED_OUTPATIENT_CLINIC_OR_DEPARTMENT_OTHER): Payer: Self-pay | Admitting: Orthopedic Surgery

## 2012-07-22 MED ORDER — OXYCODONE-ACETAMINOPHEN 5-325 MG PO TABS
2.0000 | ORAL_TABLET | ORAL | Status: AC | PRN
  Administered 2012-07-22: 2 via ORAL

## 2012-07-22 NOTE — Op Note (Signed)
NAMEMarland Kitchen  Nicole Abbott, Nicole Abbott NO.:  [id number]  MEDICAL RECORD NO.:  [id number]  LOCATION:                                 FACILITY:  PHYSICIAN:  Harvie Junior, M.D.   DATE OF BIRTH:  1946-03-26  DATE OF PROCEDURE:  07/21/2012 DATE OF DISCHARGE:                              OPERATIVE REPORT   PREOPERATIVE DIAGNOSIS:  Achilles tendon avulsion rupture with delayed diagnosis of 4 months.  POSTOPERATIVE DIAGNOSIS:  Achilles tendon avulsion rupture with delayed diagnosis of 4 months.  PRINCIPAL PROCEDURES: 1. Achilles tendon repair of torn Achilles tendon to bone with     FiberWire suture in a Krackow technique. 2. Haglund's debridement. 3. Gastroc recession at the musculotendinous junction.  SURGEON:  Harvie Junior, M.D.  ASSISTANT:  Marshia Ly, PA.  ANESTHESIA:  General.  BRIEF HISTORY: Nicole Abbott is a 67 year old female with history of having had an injury to her heel approximately 5 months ago.  She was treated initially with a CAM worker with heel lifts and things because she did not get better over time.  MRI was obtained.  MRI showed that she had this avulsion injury from her heel and because of complaints of pain and lack of power to push off, we felt that repair was going to be appropriate and she was brought to the operating room for this procedure.  PROCEDURE:  The patient was brought to the operating and after adequate anesthesia was obtained with general anesthetic, the patient was placed supine on the operating table.  She was then moved into the prone position and all bony prominences were well padded.  Attention was then turned to the left leg where after exsanguination, the blood pressure tourniquet was inflated to 350 mmHg.  Following this, an incision was made over the posterior aspect of the tendon down to the calcaneus and this was a full-thickness flap through the paratenon.  The upper portion of the tendon looked quite good.  You  could follow the tendon down to where the rupture was.  There was some inflammatory-looking tissue and then there was actually some descent tissue on the heel.  When we incised the tendon to try to get down to the heel, there was some fluid that came out was yellow, it was somewhat thickest.  Certainly, in a certain situation, it could thought to be pus and at that point, we were concerned about certainly our initial treatment plan.  At that point, we debrided this wound thoroughly.  Really, the tendon itself looked as though, I would have expected at the look.  There was some ragged tendon portions down towards the heel, but then there was much better tendon just proximal to that and that was we anticipated would the case.  So, at that point, we felt that the tendon looked not like it have been infected, but still we were uncertain about what was going to be the best course of action here.  At this point, we sent the fluid over for stat Gram stain and proceeded with additional washout.  Ultimately, the thought process was that if we were not infected that, we probably would certainly want to have gone  to some repair.  If we were infected, we certainly would not want to have a full-blown four anchor suture repair, which was the initial plan after a lot of debate and concern that if we just debrided the wound and came back that wound healing issues become such a difficult problem.  I felt that we have to have some opportunity to try to repair this tendon in a way that the best possible way did she have some kind of infection.  I thought little bit about PDS, but I felt like I could not get something strong enough to be weaving through bone tunnels that would not abrade and tear, and at that point, we were assured enough with #2 FiberWires, what I thought would be the best choice understanding that obviously, just bit of a foreign body if these were a full-blown infection.  I felt like that  I wanted to do it in a way that would allow me to get the FiberWire out to a reasonable small incision if it appeared that it was going to be an infection and we could kept it at bay long enough for the tendon to heal and it was part of our thought process.  At that point, what I felt like was the appropriate course of action and again looking at the tissue, which looked pretty good.  Waiting for the Gram stain result to come back, which was negative and so, we felt that we would proceed at that point.  At this point, we went up because we have certain that we did not have enough excursion of getting tendon down to bone and did a gastroc recession, we did that by making a small medial incision dissecting down to the fascia of the gastroc and incising the fascia of the gastroc, we were careful to protect the sural nerve and bundle and did dissect down to get the septa out of the muscle, but we were very protective of the muscle fibers at this point.  Once we did that, we could certainly get a better excursion down distally.  At this point, we drilled two holes through the plantar aspect of the calcaneus and at the area of where the Haglund's deformity had been excised.  So, we did a saw excision of the Haglund's deformity creating a large bony bed, and at this point, we passed two drill holes through that.  We passed two Krackow stitches through the tendon and then we passed the two left stitches through the left bone tunnel, the two right stitches through the right bone tunnel, and then we repassed those stitches back up through the bone tunnel on each side creating a loop of suture down through the bone tunnel around a very strong cortical bridge of calcaneus.  Once we brought those stitches up, we sucked the tendon right down under this area and then tied those stitches right over the back portion of the tendon and this gave actually a quite nice repair of tendon to bone at this point.   With the gastroc recession, we were able to get her almost a neutral without any undue tension, but it was probably gravity at about 15 degrees, really seemed to be where it wanted to stay the most.  At that point, we irrigated and closed the proximal wound and when we made that incision, we were very careful to get a whole separate set of instruments, change gloves and did that whole portion of the case with a whole  separate set of instruments and with change in gloves, that was irrigated and closed prior to it was going back to the original wound.  At that point, we did as we had outlined, repairing that down to that bleeding bed of bone. We felt like we had achieved a very nice repair and irrigated this wound again thoroughly.  During the case, we had done a pulsatile lavage of 6 listers of normal saline irrigation to washout as best we could the wound after it was debrided.  At this point, the wound was again irrigated, suctioned dry, closed in layers with nylon and the skin, and we put her in gravity equinus splint and took her to the recovery room. We changed our plan to keep her overnight and I will sent her out on oral antibiotics with a plan to discuss the case with Infectious Disease and have follow up per their guidance.     Harvie Junior, M.D.     Ranae Plumber  D:  07/21/2012  T:  07/22/2012  Job:  [id number]

## 2012-07-24 LAB — CULTURE, ROUTINE-ABSCESS: Culture: NO GROWTH

## 2012-07-26 LAB — ANAEROBIC CULTURE

## 2012-07-28 ENCOUNTER — Inpatient Hospital Stay: Payer: Medicare Other | Admitting: Infectious Disease

## 2013-02-01 ENCOUNTER — Other Ambulatory Visit: Payer: Self-pay | Admitting: Cardiovascular Disease

## 2013-02-15 ENCOUNTER — Other Ambulatory Visit: Payer: Self-pay | Admitting: Cardiovascular Disease

## 2013-02-15 NOTE — Telephone Encounter (Signed)
Pharmacy requesting medication. Fax Received. Refill Completed. Kiel Cockerell Chowoe (R.M.A)

## 2013-02-26 ENCOUNTER — Other Ambulatory Visit: Payer: Self-pay | Admitting: Family Medicine

## 2013-05-02 ENCOUNTER — Encounter: Payer: Self-pay | Admitting: Cardiovascular Disease

## 2013-05-02 ENCOUNTER — Ambulatory Visit (INDEPENDENT_AMBULATORY_CARE_PROVIDER_SITE_OTHER): Payer: Medicare Other | Admitting: Cardiovascular Disease

## 2013-05-02 VITALS — BP 160/80 | HR 92 | Ht 64.0 in | Wt 241.0 lb

## 2013-05-02 DIAGNOSIS — I10 Essential (primary) hypertension: Secondary | ICD-10-CM

## 2013-05-02 DIAGNOSIS — I517 Cardiomegaly: Secondary | ICD-10-CM

## 2013-05-02 DIAGNOSIS — E785 Hyperlipidemia, unspecified: Secondary | ICD-10-CM

## 2013-05-02 LAB — BASIC METABOLIC PANEL
CO2: 25 mEq/L (ref 19–32)
Chloride: 105 mEq/L (ref 96–112)
Glucose, Bld: 148 mg/dL — ABNORMAL HIGH (ref 70–99)
Potassium: 3.8 mEq/L (ref 3.5–5.1)
Sodium: 141 mEq/L (ref 135–145)

## 2013-05-02 LAB — HEPATIC FUNCTION PANEL
Bilirubin, Direct: 0.1 mg/dL (ref 0.0–0.3)
Total Bilirubin: 0.6 mg/dL (ref 0.3–1.2)
Total Protein: 8.1 g/dL (ref 6.0–8.3)

## 2013-05-02 LAB — LIPID PANEL
HDL: 60.3 mg/dL (ref >39.00)
Total CHOL/HDL Ratio: 4
VLDL: 14 mg/dL (ref 0.0–40.0)

## 2013-05-02 MED ORDER — LISINOPRIL 40 MG PO TABS: 40.0000 mg | ORAL_TABLET | Freq: Every day | ORAL | Status: DC

## 2013-05-02 MED ORDER — POTASSIUM CHLORIDE ER 10 MEQ PO TBCR: EXTENDED_RELEASE_TABLET | ORAL | Status: DC

## 2013-05-02 MED ORDER — HYDROCHLOROTHIAZIDE 25 MG PO TABS: ORAL_TABLET | ORAL | Status: DC

## 2013-05-02 MED ORDER — NEBIVOLOL HCL 10 MG PO TABS: 10.0000 mg | ORAL_TABLET | Freq: Every day | ORAL | Status: DC

## 2013-05-02 NOTE — Progress Notes (Signed)
Nicole Abbott Date of Birth  Jan 14, 1946       [facility]    [place] [address]  [address], [address] [place], [place]  [address]   [place], [place]  [address] [phone]     [phone]   Fax  [phone]    Fax [phone]  Problem List: 1. Hypertension 2. Left ventricular hypertrophy by echo 08/08/11 Left ventricle: There was moderate concentric hypertrophy. Systolic function was normal. The estimated ejection fraction was in the range of 60% to 65%. Wall motion was normal; there were no regional wall motion abnormalities. Features are consistent with a pseudonormal left ventricular filling pattern, with concomitant abnormal relaxation and increased filling pressure (grade 2 diastolic dysfunction). Doppler parameters are consistent with high ventricular filling pressure.  3. Fibromyalgia 4. Back pain  History of Present Illness:  Nicole Abbott is a 67 y.o. female with hx of HTN.  She was recently admitted to the hospital 2 months ago with episodes of chest pain and shortness of breath. 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 Her medicines were adjusted. She had an echocardiogram during the hospitalization which revealed moderate left ventricular hypertrophy.  She does not get any regular exercise.  She eats some bacon and sausage on occasion.  She does not eat lots of salt.  She started taking clonidine. Her back pain is much better.  We talked about her fibromyalgia.  She thinks that she is sleeping better.  She forgot to take her clonidine last night.  Nov. 3, 2014:  She did not go have a sleep study.   She does not want to go through with the study.  She refuses to use a CPAP even if she is found to have sleep apnea.   She has been dealing with her fibromyalgia for years.   She is having lots of back pain -  She had achilles surgery this past year and now has significant back limitions.  She tries to limit her salt  .   Current Outpatient Prescriptions on File Prior to Visit  Medication Sig Dispense Refill  . aspirin EC 325 MG EC tablet Take 1 tablet (325 mg total) by mouth 2 (two) times daily after a meal.  30 tablet  0  . cloNIDine (CATAPRES) 0.1 MG tablet TAKE 1 TABLET BY MOUTH TWICE DAILY  60 tablet  3  . docusate-casanthranol (PERICOLACE) 100-30 MG per capsule Take 3 capsules by mouth daily.       . hydrochlorothiazide (HYDRODIURIL) 25 MG tablet TAKE 1 TABLET BY MOUTH DAILY  90 tablet  0  . lisinopril (PRINIVIL,ZESTRIL) 40 MG tablet Take 1 tablet (40 mg total) by mouth daily.  90 tablet  3  . NAPROXEN SODIUM PO Take by mouth daily.      Bertram Gala Glycol-Propyl Glycol (SYSTANE OP) Apply to eye 2 (two) times daily.      . potassium chloride (K-DUR) 10 MEQ tablet TAKE 1 TABLET BY MOUTH DAILY  30 tablet  2  . ranitidine (ZANTAC) 150 MG tablet Take 150 mg by mouth 2 (two) times daily.      . Coenzyme Q10 (CO Q 10) 100 MG CAPS Take 1 tablet by mouth daily.      . magnesium oxide (MAG-OX) 400 MG tablet Take 400 mg by mouth at bedtime.      . mometasone (NASONEX) 50 MCG/ACT nasal spray Place 2 sprays into the nose as needed.      Marland Kitchen  Multiple Vitamins-Minerals (MULTIVITAMIN WITH MINERALS) tablet Take 1 tablet by mouth daily.      Marland Kitchen oxyCODONE-acetaminophen (PERCOCET/ROXICET) 5-325 MG per tablet Take 1-2 tablets by mouth every 6 (six) hours as needed for pain.  50 tablet  0  . sulfamethoxazole-trimethoprim (BACTRIM DS) 800-160 MG per tablet Take 1 tablet by mouth 2 (two) times daily.  20 tablet  1  . [DISCONTINUED] potassium chloride (K-DUR,KLOR-CON) 10 MEQ tablet Take 1 tablet (10 mEq total) by mouth daily.  30 tablet  5   No current facility-administered medications on file prior to visit.    Allergies  Allergen Reactions  . Penicillins Anaphylaxis  . Codeine     Causes ileus    Past Medical History  Diagnosis Date  . Hypertension   . Arthritis   . Asthma     as child  . Chronic constipation    . GERD (gastroesophageal reflux disease)   . Wears glasses     Past Surgical History  Procedure Laterality Date  . Tubal ligation    . Cystocele repair    . Colonoscopy    . Dilation and curettage of uterus  62  . Achilles tendon surgery  07/21/2012    Procedure: ACHILLES TENDON REPAIR;  Surgeon: Harvie Junior, MD;  Location:  S[facility];  Service: Orthopedics;  Laterality: Left;   haglun and gastroc resection    History  Smoking status  . Former Smoker  . Quit date: 06/30/1984  Smokeless tobacco  . Not on file    History  Alcohol Use  . Yes    Comment: Occasional wine drinker    Family History  Problem Relation Age of Onset  . Diabetes Mother   . Hypertension Mother   . Diabetes Sister   . Hypertension Sister   . Thyroid disease Sister   . Thyroid disease Brother   . Asthma Son   . Asthma Grandchild   . Asthma Grandchild   . Asthma Grandchild     Reviw of Systems:  Reviewed in the HPI.  All other systems are negative.  Physical Exam: Blood pressure 170/90, pulse 92, height 5\' 4"  (1.626 m), weight 241 lb (109.317 kg). General: Well developed, well nourished, in no acute distress.  Head: Normocephalic, atraumatic, sclera non-icteric, mucus membranes are moist,   Neck: Supple. Carotids are 2 + without bruits. No JVD  Lungs: Clear bilaterally to auscultation.  Heart: regular rate.  normal  S1 S2. No murmurs, gallops or rubs.  Abdomen: Soft, non-tender, non-distended with normal bowel sounds. No hepatomegaly. No rebound/guarding. No masses.  Msk:  Strength and tone are normal  Extremities: No clubbing or cyanosis. No edema.  Distal pedal pulses are 2+ and equal bilaterally.  Neuro: Alert and oriented X 3. Moves all extremities spontaneously.  Psych:  Responds to questions appropriately with a normal affect.  ECG: Nov, 3, 2014:  NSR at 6  Assessment / Plan:

## 2013-05-02 NOTE — Patient Instructions (Signed)
Your physician recommends that you return for lab work in: today   Your physician has recommended you make the following change in your medication:  STOP- CLONIDINE START- BYSTOLIC 10 MG ONCE DAILY   Your physician recommends that you schedule a follow-up appointment in: 3 MONTHS EKG/ BMET

## 2013-05-02 NOTE — Assessment & Plan Note (Addendum)
Nicole Abbott has her own thoughts about treatment of her BP.  She refuses to get the sleep study.  She refuses to wear a CPAP - even if she does have sleep apnea.  She does not want to try to achieved a BP of 120 / 80 but thinks that 150 systolic is ok for her.  She acknowledges that her BP would be lower if she lost 20 lbs but she is having so much pain in her back and leg that she is not interested in doing that now.     I have explained that this would increase her risk of stroke.    She has tried multiple meds and stopped them - amlodipine caused leg swelling.  Hydralazine was poorly tolerated.    I have encouraged her to get a stationary bike or to join [employer] and do water aerobics.    After much discussion, she has agreed to try Bystolic 10 mg a day.  We will DC the clonidine - she was taking it only sporadically and 1/4 of the prescribed dose even when she did take it. ( 0.05 mg at night).  Will check labs today. Will see her again in 3 months for repeat ov and labs.

## 2013-05-06 ENCOUNTER — Telehealth: Payer: Self-pay | Admitting: *Deleted

## 2013-05-06 MED ORDER — CLONIDINE HCL 0.1 MG PO TABS: 0.1000 mg | ORAL_TABLET | Freq: Two times a day (BID) | ORAL | Status: DC

## 2013-05-06 NOTE — Telephone Encounter (Signed)
Pt started the bystolic and it caused her to have a headache and states her bp elevated.  She went back on clonidine,  During HA  And bystolic use bp 202/98 Later  Stopped bystolic and restarted clonidine bp now running 158/82. Pt's MAR was updated. Please advise.

## 2013-05-09 NOTE — Telephone Encounter (Signed)
LMTCB

## 2013-05-09 NOTE — Telephone Encounter (Signed)
PT MAY INCREASE CLONADINE TO 0.1 MG 3 TIMES A DAY // EVERY 8 HRS LIKE 6AM 2 PM 10 PM.

## 2013-05-09 NOTE — Telephone Encounter (Signed)
No suggestions

## 2013-05-10 NOTE — Telephone Encounter (Signed)
Pt will take clonidine 2 x day.

## 2013-07-29 ENCOUNTER — Ambulatory Visit: Payer: Medicare Other | Admitting: Cardiovascular Disease

## 2013-09-29 ENCOUNTER — Telehealth: Payer: Self-pay | Admitting: Radiology

## 2013-09-29 NOTE — Telephone Encounter (Signed)
[facility] called about home health orders. Have advised caller from agency we are unable to sign orders for patient, as she has not been seen in greater than 1 year. Would be happy to see her if needed for face to face eval, to you FYI you can disregard form if you receive them.

## 2013-10-14 ENCOUNTER — Ambulatory Visit (INDEPENDENT_AMBULATORY_CARE_PROVIDER_SITE_OTHER): Payer: Medicare Other | Admitting: Family Medicine

## 2013-10-14 VITALS — BP 192/90 | HR 82 | Temp 98.2°F | Resp 16 | Wt 243.4 lb

## 2013-10-14 DIAGNOSIS — I517 Cardiomegaly: Secondary | ICD-10-CM

## 2013-10-14 DIAGNOSIS — R7309 Other abnormal glucose: Secondary | ICD-10-CM

## 2013-10-14 DIAGNOSIS — R7302 Impaired glucose tolerance (oral): Secondary | ICD-10-CM | POA: Insufficient documentation

## 2013-10-14 DIAGNOSIS — E785 Hyperlipidemia, unspecified: Secondary | ICD-10-CM

## 2013-10-14 DIAGNOSIS — N952 Postmenopausal atrophic vaginitis: Secondary | ICD-10-CM

## 2013-10-14 DIAGNOSIS — I10 Essential (primary) hypertension: Secondary | ICD-10-CM

## 2013-10-14 LAB — POCT GLYCOSYLATED HEMOGLOBIN (HGB A1C): Hemoglobin A1C: 8.1

## 2013-10-14 MED ORDER — POTASSIUM CHLORIDE ER 10 MEQ PO TBCR: EXTENDED_RELEASE_TABLET | ORAL | Status: DC

## 2013-10-14 MED ORDER — LISINOPRIL 40 MG PO TABS: 40.0000 mg | ORAL_TABLET | Freq: Every day | ORAL | Status: DC

## 2013-10-14 MED ORDER — HYDROCHLOROTHIAZIDE 25 MG PO TABS: ORAL_TABLET | ORAL | Status: DC

## 2013-10-14 MED ORDER — ESTROGENS, CONJUGATED 0.625 MG/GM VA CREA
TOPICAL_CREAM | VAGINAL | Status: DC
Start: 2013-10-14 — End: 2015-03-15

## 2013-10-14 MED ORDER — BLOOD GLUCOSE TEST VI STRP
1.0000 | ORAL_STRIP | Freq: Every day | Status: DC
Start: 2013-10-14 — End: 2014-01-26

## 2013-10-14 NOTE — Patient Instructions (Addendum)
Exercise to Stay Healthy Exercise helps you become and stay healthy. EXERCISE IDEAS AND TIPS Choose exercises that:  You enjoy.  Fit into your day. You do not need to exercise really hard to be healthy. You can do exercises at a slow or medium level and stay healthy. You can:  Stretch before and after working out.  Try yoga, Pilates, or tai chi.  Lift weights.  Walk fast, swim, jog, run, climb stairs, bicycle, dance, or rollerskate.  Take aerobic classes. Exercises that burn about 150 calories:  Running 1  miles in 15 minutes.  Playing volleyball for 45 to 60 minutes.  Washing and waxing a car for 45 to 60 minutes.  Playing touch football for 45 minutes.  Walking 1  miles in 35 minutes.  Pushing a stroller 1  miles in 30 minutes.  Playing basketball for 30 minutes.  Raking leaves for 30 minutes.  Bicycling 5 miles in 30 minutes.  Walking 2 miles in 30 minutes.  Dancing for 30 minutes.  Shoveling snow for 15 minutes.  Swimming laps for 20 minutes.  Walking up stairs for 15 minutes.  Bicycling 4 miles in 15 minutes.  Gardening for 30 to 45 minutes.  Jumping rope for 15 minutes.  Washing windows or floors for 45 to 60 minutes. Document Released: 07/19/2010 Document Revised: 09/08/2011 Document Reviewed: 07/19/2010 [facility] Patient Information 2014 [place], [place].     How to Increase Fiber in the Meal Plan for Diabetes Increasing fiber in the diet has many benefits including lowering blood cholesterol, helping to control blood glucose (sugar), preventing constipation, and aiding in weight management by helping you feel full longer. Start adding fiber to your diet slowly. A gradual substitution of high-fiber foods for low-fiber foods will allow the digestive tract to adjust. Most men under 49 years of age should aim to eat 38 g of fiber a day. Women should aim for 25 g. Over 41 years of age, most men need 30 g of fiber and most women need 21 g. Below are  some suggestions for increasing fiber.  Try whole-wheat bread instead of white bread. Look for words high on the list of ingredients, such as whole wheat, whole rye, or whole oats.  Try a baked potato with skin instead of mashed potatoes.  Try a fresh apple with skin instead of applesauce.  Try a fresh orange instead of orange juice.  Try popcorn instead of potato chips.  Try bran cereal instead of corn flakes.  Try kidney, whole pinto, or garbanzo beans instead of bread.  Try whole-grain crackers instead of saltine crackers.  Try whole-wheat pasta instead of regular varieties. While on a high-fiber diet:   Drink enough water and fluids to keep your urine clear or pale yellow.  Eat a variety of high fiber foods such as fruits, vegetables, whole grains, nuts, and seeds.  Aim for 5 servings of fruit and vegetables per day.  Try to increase your intake of fiber by eating high-fiber foods instead of taking fiber supplements that contain small amounts of fiber. There can be additional benefits for long-term health and blood glucose control with high-fiber foods.  SOURCES OF FIBER The following list shows the average dietary fiber for types of food in the various food groups. Starches and Breads / Dietary Fiber (g)  Whole-grain bread, 1 slice / 2 g  Whole-grain cereals,  cup / 3 g  Bran cereals,  to  cup / 8 g  Starchy vegetables,  cup / 3  g  Oatmeal,  cup / 2 g  Whole-wheat pasta,  cup / 2 g  Brown rice,  cup / 2 g  Barley,  cup / 3 g Legumes / Dietary Fiber (g)  Beans,  cup / 8 g  Peas,  cup / 8 g  Lentils ,  cup / 8 g Meat and Meat Substitutes / Dietary Fiber (g) This group averages 0 grams of fiber. Exceptions are:  Nuts, seeds, 1 oz or  cup / 3 g  Chunky peanut butter, 2 tbs / 3 g Vegetables / Dietary Fiber (g)  Cooked vegetables,  to  cup / 2 to 3 g  Raw vegetables, 1 to 2 cups / 2 to 3 g Fruit / Dietary Fiber (g)  Raw or cooked fruit,   cup or 1 small, fresh piece / 2 g Milk / Dietary Fiber (g)  Milk, 1 cup or 8 oz / 0 g Fats and Oils / Dietary Fiber (g)  Fats and oils, 1 tsp / 0 g You can determine how much fiber you are eating by reading the Nutrition Facts panel on the labels of the foods you eat. FIBER IN SPECIFIC FOODS Cereals / Dietary Fiber (g)  All Bran,  cup / 9 g  All Bran with Extra Fiber,  cup / 13 g  Bran Flakes,  cup / 4 g  Cheerios,  cup / 1.5 g  Corn Bran,  cup / 4 g  Corn Flakes,  cup / 0.75 g  Cracklin' Oat Bran,  cup / 4 g  Fiber One,  cup / 13 g  Grape Nuts, 3 tbs / 3 g  Grape Nuts Flakes,  cup / 3 g  Noodles,  cup, cooked / 0.5 g  Nutrigrain Wheat,  cup / 3.5 g  Oatmeal,  cup, cooked / 1.1 g  Pasta, white (macaroni, spaghetti),  cup, cooked / 0.5 g  Pasta, whole-wheat (macaroni, spaghetti),  cup, cooked / 2 g  [place],  cup, cooked / 3 g  Rice, wild,  cup, cooked / 0.5 g  Rice, brown,  cup, cooked / 1 g  Rice, white,  cup, cooked / 0.2 g  Shredded Wheat, bite-sized,  cup / 2 g  Total,  cup / 1.75 g  Wheat Chex,  cup / 2.5 g  [place]heat[place],  cup, cooked / 4 g  Wheaties,  cup / 2.75 g Bread, Starchy Vegetables, and Dried Peas and Beans / Dietary Fiber (g)  Bagel, whole / 0.6 g  Baked beans in tomato sauce,  cup, cooked / 3 g  Bran muffin, 1 small / 2.5 g  Bread, cracked wheat, 1 slice / 2.5 g  Bread, pumpernickel, 1 slice / 2.5 g  Bread, white, 1 slice / 0.4 g  Bread, whole-wheat, 1 slice / 1.4 g  Corn,  cup, canned / 2.9 g  Kidney beans,  cup, cooked / 3.5 g  Lentils, cup, cooked / 3 g  Lima beans,  cup, cooked / 4 g  Navy beans,  cup, cooked / 4 g  Peas,  cup, cooked / 4 g  Popcorn, 3 cups popped, unbuttered / 3.5 g  Potato, baked (with skin), 1 small / 4 g  Potato, baked (without skin), 1 small / 2 g  Ry-Krisp, 4 crackers / 3 g  Saltine crackers, 6 squares / 0 g  Split peas,  cup, cooked / 2.5  g  Yams (sweet potato),  cup / 1.7 g Fruit / Dietary Fiber (  g)  Apple, 1 small, fresh, with skin / 4 g  Apple juice,  cup / 0.4 g  Apricots, 4 medium, fresh / 4 g  Apricots, 7 halves, dried / 2 g  Banana,  medium / 1.2 g  Blueberries,  cup / 2 g  Cantaloupe, melon / 1.3 g  Cherries,  cup, canned / 1.4 g  Grapefruit,  medium / 1.6 g  Grapes, 15 small / 1.2 g  Grape juice,  cup / 0.5 g  Orange, 1 medium, fresh / 2 g  Orange juice,  cup / 0.5 g  Peach, 1 medium,fresh, with skin / 2 g  Pear, 1 medium, fresh, with skin / 4 g  Pineapple, cup, canned / 0.7 g  Plums, 2 whole / 2 g  Prunes, 3 whole / 1.5 g  Raspberries, 1 cup / 6 g  Strawberries, 1  cup / 4 g  Watermelon, 1  cup / 0.5 g Vegetables / Dietary Fiber (g)  Asparagus,  cup, cooked / 1 g  Beans, green and wax,  cup, cooked / 1.6 g  Beets,  cup, cooked / 1.8 g  Broccoli,  cup, cooked / 2.2 g  Brussels sprouts,  cup, cooked / 4 g  Cabbage,  cup, cooked / 2.5 g  Carrots,  cup, cooked / 2.3 g  Cauliflower,  cup, cooked / 1.1 g  Celery, 1 cup, raw / 1.5 g  Cucumber, 1 cup, raw / 0.8 g  Green pepper,  cup sliced, cooked / 1.5 g  Lettuce, 1 cup, sliced / 0.9 g  Mushrooms, 1 cup sliced, raw / 1.8 g  Onion, 1 cup sliced, raw / 1.6 g  Spinach,  cup, cooked / 2.4 g  Tomato, 1 medium, fresh / 1.5 g  Tomato juice,  cup / 0 g  Zucchini,  cup, cooked / 1.8 g Document Released: 12/06/2001 Document Revised: 10/11/2012 Document Reviewed: 01/02/2009 [facility] Patient Information 2014 [place], [place].

## 2013-10-14 NOTE — Progress Notes (Signed)
Subjective:    Patient ID: Nicole Abbott, female    DOB: 10-15-1945, 68 y.o.   MRN: [id number]  HPI  This 68 y.o. AA female is here for HTN follow-up; she has LVH and was evaluated by Dr. Elease HashimotoNahser last year, most recently in November 2014. He briefly discussed sleep study w/ her; she declines to have this done (she remarked that she did not want to be "a [place] pig" for different treatments). Fibromyalgia symptoms seem to be less significant. (See notes from OV w/ Dr. Elease HashimotoNahser- pt's opinions have not changed). Medication change at that visit: Bystolic prescribed and Clonidine discontinued. Pt had HA w/ Bystolic in Nov 2014; this med was discontinued and Clonidine restarted by Dr. Elease HashimotoNahser. Pt is not taking Clonidine (her preference not to take it).  Pt  also has hx of elevated lipids w/ LDL= 163.9 at last measurement. She declines to take any medication for this. States she is working on nutrition and exercise (limited physical activity due to fibromyalgia). Pt known to have elevated blood sugars; she monitors FSBS but prefers to manage sugars w/ diet and exercise.  Pt c/o vaginal dryness and itching w/o vaginal discharge. She is menopausal.  Patient Active Problem List   Diagnosis Date Noted  . Impaired glucose tolerance in obese 10/14/2013  . Obesity, Class III, BMI 40-49.9 (morbid obesity) 03/04/2012  . Chest pain 08/17/2011  . Hyperlipidemia 08/17/2011  . Asthma 08/17/2011  . Hypertension   . Arthritis    PMHx, Surg Hx, Soc and Fam Hx reviewed.   Review of Systems  Constitutional: Negative.   HENT: Negative.   Eyes: Negative for visual disturbance.  Respiratory: Negative for cough, chest tightness, shortness of breath and wheezing.   Cardiovascular: Negative.   Gastrointestinal: Negative.   Endocrine: Negative.   Genitourinary: Positive for vaginal pain.  Musculoskeletal: Positive for myalgias.  Neurological: Negative.   Psychiatric/Behavioral: Positive for sleep disturbance.       Objective:   Physical Exam  Nursing note and vitals reviewed. Constitutional: She is oriented to person, place, and time. She appears well-developed. No distress.  HENT:  Head: Normocephalic and atraumatic.  Right Ear: External ear normal.  Left Ear: External ear normal.  Nose: Nose normal.  Mouth/Throat: Oropharynx is clear and moist.  Eyes: Conjunctivae and EOM are normal. Pupils are equal, round, and reactive to light. No scleral icterus.  Neck: Neck supple. No thyromegaly present.  Cardiovascular: Normal rate, regular rhythm and normal heart sounds.   Pulmonary/Chest: Effort normal and breath sounds normal. No respiratory distress.  Genitourinary: There is no rash, tenderness or lesion on the right labia. There is no rash, tenderness or lesion on the left labia.  Vaginal mucosa w/ mild erythema and atrophy changes.  Musculoskeletal: Normal range of motion. She exhibits tenderness. She exhibits no edema.  Neurological: She is alert and oriented to person, place, and time. No cranial nerve deficit. Coordination normal.  Skin: Skin is warm and dry. No rash noted. She is not diaphoretic. No erythema.  Psychiatric: Her speech is normal and behavior is normal. Thought content normal. Her mood appears not anxious. Her affect is blunt. Her affect is not angry and not labile. Cognition and memory are normal. She does not exhibit a depressed mood.    Results for orders placed in visit on 10/14/13  POCT GLYCOSYLATED HEMOGLOBIN (HGB A1C)      Result Value Ref Range   Hemoglobin A1C 8.1        Assessment &  Plan:  Hypertension - Pt declines to adhere to treatment plan; encouraged nutrition changes and weight loss.        Plan: hydrochlorothiazide (HYDRODIURIL) 25 MG tablet, lisinopril (PRINIVIL,ZESTRIL) 40 MG tablet, potassium chloride (K-DUR) 10 MEQ tablet  Hyperlipidemia - Pt declines medication to lower CHD risk. Will work on heart healthy nutrition. Plan: hydrochlorothiazide  (HYDRODIURIL) 25 MG tablet, lisinopril (PRINIVIL,ZESTRIL) 40 MG tablet, potassium chloride (K-DUR) 10 MEQ tablet  LVH (left ventricular hypertrophy) - Plan: hydrochlorothiazide (HYDRODIURIL) 25 MG tablet, lisinopril (PRINIVIL,ZESTRIL) 40 MG tablet, potassium chloride (K-DUR) 10 MEQ tablet  Elevated glucose - Medication declined at this time; maximize TLCs. Plan: POCT glycosylated hemoglobin (Hb A1C)  Postmenopause atrophic vaginitis- Trial topical vaginal estrogen cream.  Meds ordered this encounter  Medications  . hydrochlorothiazide (HYDRODIURIL) 25 MG tablet    Sig: TAKE 1 TABLET BY MOUTH DAILY    Dispense:  90 tablet    Refill:  1  . lisinopril (PRINIVIL,ZESTRIL) 40 MG tablet    Sig: Take 1 tablet (40 mg total) by mouth daily.    Dispense:  90 tablet    Refill:  1  . potassium chloride (K-DUR) 10 MEQ tablet    Sig: TAKE 1 TABLET BY MOUTH DAILY    Dispense:  90 tablet    Refill:  1  . conjugated estrogens (PREMARIN) vaginal cream    Sig: Use 1/4 applicatorful to vaginal area hs.    Dispense:  42.5 g    Refill:  5  . Glucose Blood (BLOOD GLUCOSE TEST STRIPS) STRP    Sig: 1 strip by In Vitro route daily before breakfast.    Dispense:  100 each    Refill:  3    One Touch test strips needed.

## 2013-10-17 ENCOUNTER — Other Ambulatory Visit: Payer: Self-pay

## 2013-10-18 ENCOUNTER — Encounter: Payer: Self-pay | Admitting: Family Medicine

## 2013-11-21 IMAGING — CR DG OS CALCIS 2+V*L*
2 series · 2 of 2 positions shown · non-contrast
Comparison: None.

CLINICAL DATA: Heel pain.

LEFT OS CALCIS - 2+ VIEW

[axial]
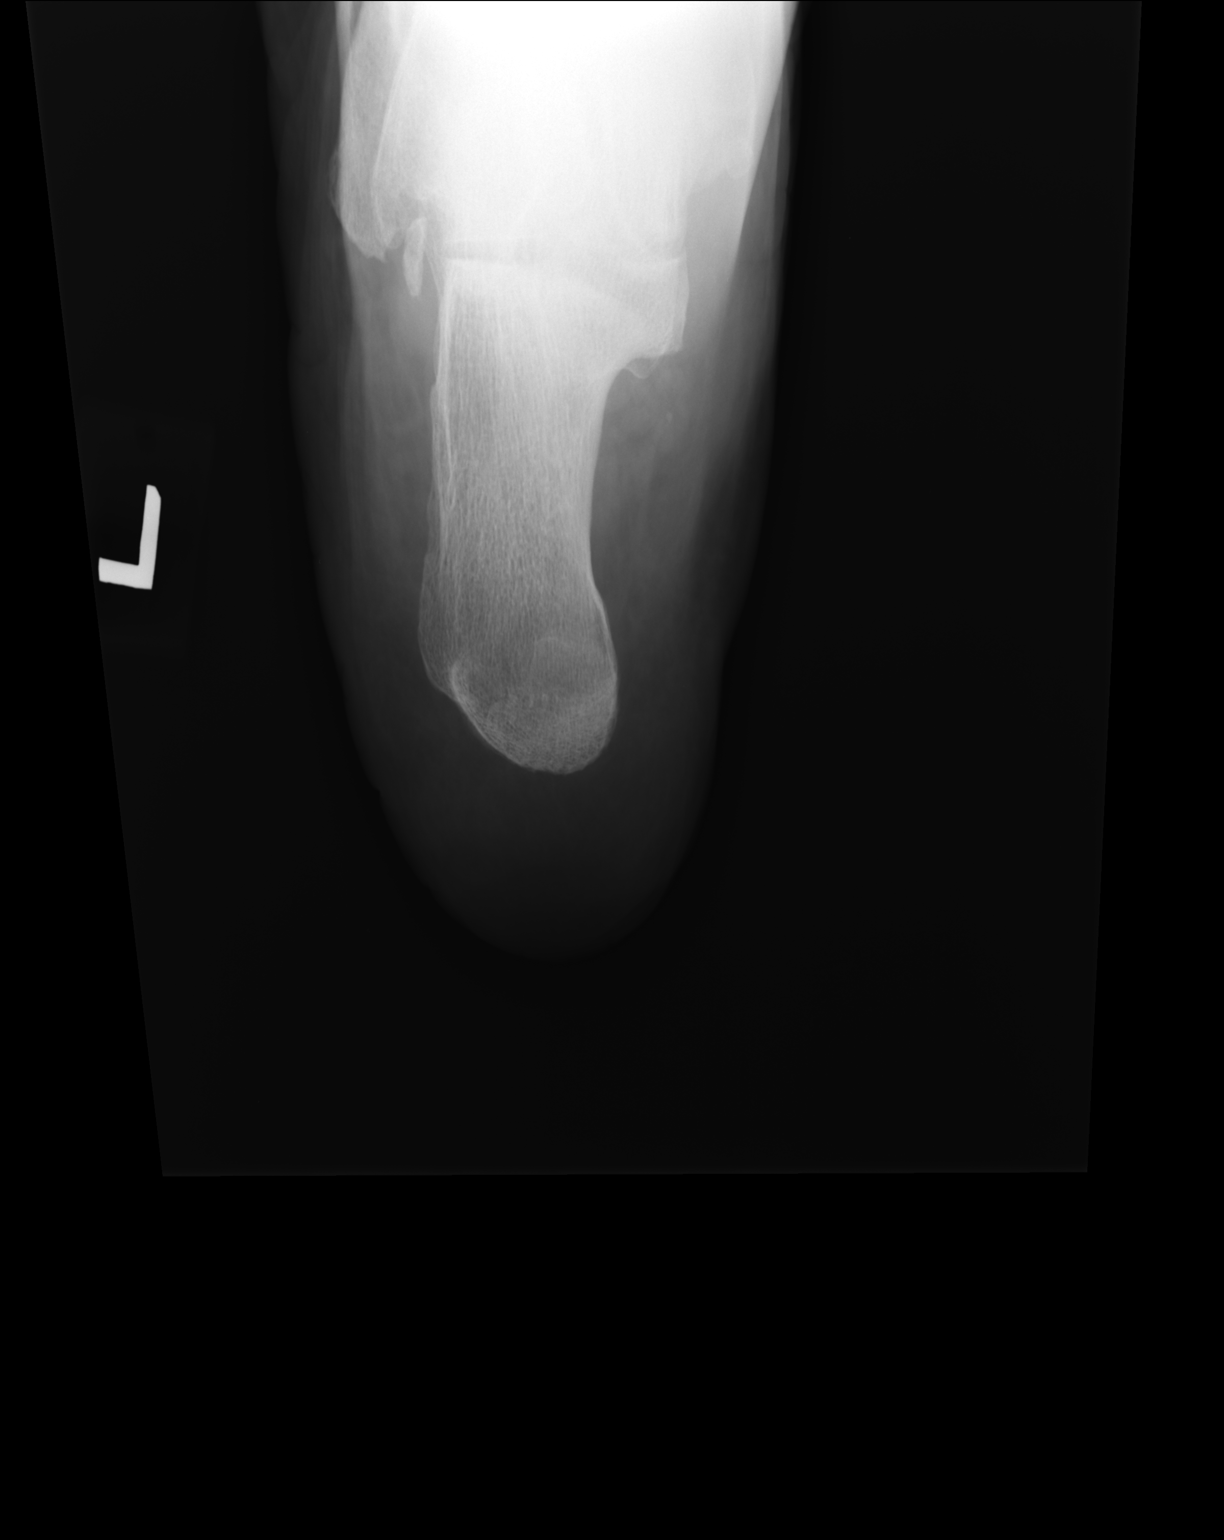

[lateral]
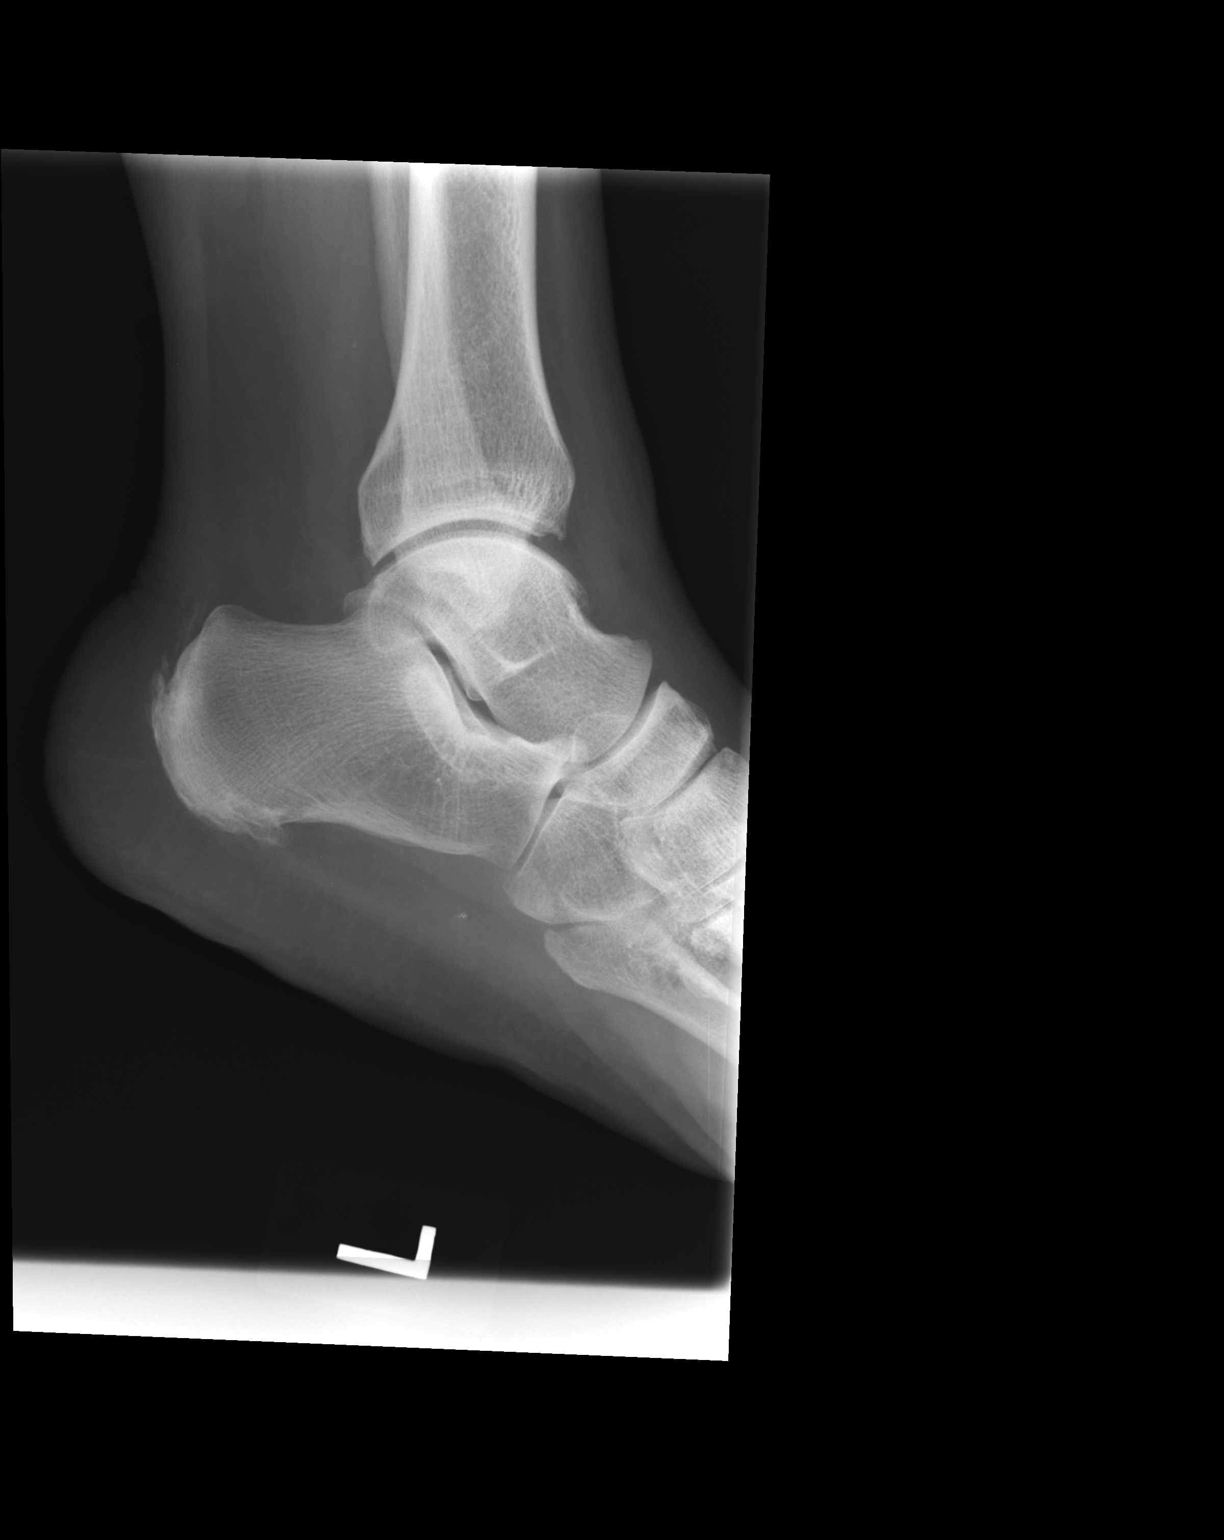

[2 of 2 positions shown; findings below may reference images not displayed]

FINDINGS: Small plantar calcaneal spur. No acute bony abnormality.
Specifically, no fracture, subluxation, or dislocation.  Soft
tissues are intact.
IMPRESSION: No acute bony abnormality.

Clinically significant discrepancy from primary report, if
provided: None

## 2014-01-19 ENCOUNTER — Ambulatory Visit: Payer: Medicare Other | Admitting: Family Medicine

## 2014-01-26 ENCOUNTER — Telehealth: Payer: Self-pay

## 2014-01-26 MED ORDER — ONETOUCH ULTRASOFT LANCETS MISC: Status: AC

## 2014-01-26 MED ORDER — BLOOD GLUCOSE TEST VI STRP: 1.0000 | ORAL_STRIP | Freq: Every day | Status: DC

## 2014-01-26 NOTE — Telephone Encounter (Signed)
Pt is needing a prescription for Lancets for the one touch ultra to [address] [facility] [address], please advise pt

## 2014-01-26 NOTE — Telephone Encounter (Signed)
Sent in refill of lancets.

## 2014-02-22 ENCOUNTER — Encounter: Payer: Self-pay | Admitting: Family Medicine

## 2014-03-02 ENCOUNTER — Encounter: Payer: Self-pay | Admitting: Family Medicine

## 2014-03-10 ENCOUNTER — Encounter: Payer: Self-pay | Admitting: *Deleted

## 2014-03-29 ENCOUNTER — Telehealth: Payer: Self-pay | Admitting: *Deleted

## 2014-03-29 NOTE — Telephone Encounter (Signed)
Phoned & spoke with patient for over an hour in a quite extensive & diverse conversation.  Explained, nurse to (retired) Engineer, civil (consulting)nurse, my role as liason b/w providers & ins co/medicare.  She is going to check her schedule and call me back to schedule an appointment and understands that the longer she waits to schedule, the more difficult an OV will be to get at her convenience.

## 2014-04-06 ENCOUNTER — Ambulatory Visit (INDEPENDENT_AMBULATORY_CARE_PROVIDER_SITE_OTHER): Payer: Medicare Other | Admitting: Family Medicine

## 2014-04-06 ENCOUNTER — Encounter: Payer: Self-pay | Admitting: Family Medicine

## 2014-04-06 VITALS — BP 190/90 | HR 81 | Temp 98.5°F | Resp 16 | Ht 63.75 in | Wt 243.0 lb

## 2014-04-06 DIAGNOSIS — Z1211 Encounter for screening for malignant neoplasm of colon: Secondary | ICD-10-CM

## 2014-04-06 DIAGNOSIS — Z0000 Encounter for general adult medical examination without abnormal findings: Secondary | ICD-10-CM

## 2014-04-06 DIAGNOSIS — F331 Major depressive disorder, recurrent, moderate: Secondary | ICD-10-CM | POA: Insufficient documentation

## 2014-04-06 DIAGNOSIS — Z1239 Encounter for other screening for malignant neoplasm of breast: Secondary | ICD-10-CM

## 2014-04-06 DIAGNOSIS — E785 Hyperlipidemia, unspecified: Secondary | ICD-10-CM

## 2014-04-06 DIAGNOSIS — I10 Essential (primary) hypertension: Secondary | ICD-10-CM

## 2014-04-06 LAB — COMPLETE METABOLIC PANEL WITH GFR
ALBUMIN: 4 g/dL (ref 3.5–5.2)
ALK PHOS: 72 U/L (ref 39–117)
ALT: 20 U/L (ref 0–35)
AST: 23 U/L (ref 0–37)
BILIRUBIN TOTAL: 0.5 mg/dL (ref 0.2–1.2)
BUN: 11 mg/dL (ref 6–23)
CO2: 24 meq/L (ref 19–32)
Calcium: 9.3 mg/dL (ref 8.4–10.5)
Chloride: 102 mEq/L (ref 96–112)
Creat: 0.75 mg/dL (ref 0.50–1.10)
GFR, EST NON AFRICAN AMERICAN: 82 mL/min
GLUCOSE: 160 mg/dL — AB (ref 70–99)
POTASSIUM: 4.1 meq/L (ref 3.5–5.3)
SODIUM: 137 meq/L (ref 135–145)
TOTAL PROTEIN: 7.3 g/dL (ref 6.0–8.3)

## 2014-04-06 LAB — LIPID PANEL
CHOL/HDL RATIO: 3.7 ratio
Cholesterol: 204 mg/dL — ABNORMAL HIGH (ref 0–200)
HDL: 55 mg/dL (ref >39)
LDL CALC: 137 mg/dL — AB (ref 0–99)
Triglycerides: 62 mg/dL (ref <150)
VLDL: 12 mg/dL (ref 0–40)

## 2014-04-06 LAB — THYROID PANEL WITH TSH
Free Thyroxine Index: 1.7 (ref 1.4–3.8)
T3 Uptake: 30 % (ref 22.0–35.0)
T4, Total: 5.6 ug/dL (ref 4.5–12.0)
TSH: 3.007 u[IU]/mL (ref 0.350–4.500)

## 2014-04-06 MED ORDER — OMEGA-3 KRILL OIL 1000 MG PO CAPS: 1.0000 | ORAL_CAPSULE | Freq: Every day | ORAL | Status: DC

## 2014-04-06 MED ORDER — CHLORTHALIDONE 25 MG PO TABS: 25.0000 mg | ORAL_TABLET | Freq: Every day | ORAL | Status: DC

## 2014-04-06 MED ORDER — LISINOPRIL 40 MG PO TABS: ORAL_TABLET | ORAL | Status: DC

## 2014-04-06 MED ORDER — L-METHYLFOLATE-B6-B12 3-35-2 MG PO TABS: ORAL_TABLET | ORAL | Status: DC
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
## 2014-04-06 NOTE — Addendum Note (Signed)
Addended by: Dow AdolphMCPHERSON, Mckinnley Cottier B on: 04/06/2014 06:57 PM   Modules accepted: Orders, Medications
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
## 2014-04-07 LAB — VITAMIN D 25 HYDROXY (VIT D DEFICIENCY, FRACTURES): VIT D 25 HYDROXY: 51 ng/mL (ref 30–89)

## 2014-04-07 NOTE — Addendum Note (Signed)
Addended by: Gerrianne ScalePAYNE, Rasheed Welty L on: 04/07/2014 11:31 AM   Modules accepted: Orders

## 2014-04-10 ENCOUNTER — Telehealth: Payer: Self-pay

## 2014-04-10 ENCOUNTER — Other Ambulatory Visit: Payer: Self-pay | Admitting: *Deleted

## 2014-04-10 NOTE — Telephone Encounter (Signed)
Patient came into the [facility] appointment center requesting for Ran[facility]idine 150 [facility] [place] [facility]. Stated she do not want to pay over the counter medication, she want a prescription sent over to the pharmacy so her insurance will pay for the medication. [phone].

## 2014-04-10 NOTE — Telephone Encounter (Signed)
Patient indicates she would like Zantac 150mg , please advise. She would like this today, now, not tomorrow when Dr Audria NineMcPherson returns to the office.

## 2014-04-10 NOTE — Telephone Encounter (Signed)
Fax received from [facility]:  Need to change medication from L-Methylfolatew/b6-b12 Tablets to something different- plan does not cover. Called pharmacy to obtain medication that is covered by her insurance plan: since many of these supplements are available pharmacist states that her best option would be folic acid and a b complex she can purchase.   Spoke to pt- she is going to try to contact the insurance company but would like to try to get her insurance to cover the prescriptions of the vitamins.

## 2014-04-11 MED ORDER — B COMPLEX-FOLIC ACID 500-5-200 MCG-MG-MCG PO TABS: 1.0000 | ORAL_TABLET | Freq: Two times a day (BID) | ORAL | Status: DC

## 2014-04-11 MED ORDER — RANITIDINE HCL 150 MG PO TABS: 150.0000 mg | ORAL_TABLET | Freq: Two times a day (BID) | ORAL | Status: DC

## 2014-04-11 NOTE — Telephone Encounter (Signed)
Duplicate request, have already sent to Rx pool

## 2014-04-11 NOTE — Telephone Encounter (Signed)
Benny LennertSarah Weber, PA-C took care of Zantac (raniticdine) refill. I signed the RX for the Folic acid- B complex vitamin; pt is welcome to try to get the original prescription covered by her insurance company/MCR plan.

## 2014-04-17 LAB — HEMOCCULT GUIAC POC 1CARD (OFFICE)
Card #2 Fecal Occult Blod, POC: POSITIVE
Card #2 Fecal Occult Blod, POC: POSITIVE
Card #3 Fecal Occult Blood, POC: POSITIVE
FECAL OCCULT BLD: POSITIVE
FECAL OCCULT BLD: POSITIVE
FECAL OCCULT BLD: POSITIVE
FECAL OCCULT BLD: POSITIVE
FECAL OCCULT BLD: POSITIVE
Fecal Occult Blood, POC: POSITIVE

## 2014-05-01 ENCOUNTER — Other Ambulatory Visit: Payer: Self-pay | Admitting: Family Medicine

## 2014-08-08 ENCOUNTER — Ambulatory Visit: Payer: Self-pay | Admitting: Family Medicine

## 2014-08-08 ENCOUNTER — Telehealth: Payer: Self-pay | Admitting: *Deleted

## 2014-08-08 ENCOUNTER — Telehealth: Payer: Self-pay

## 2014-08-08 MED ORDER — POTASSIUM CHLORIDE CRYS ER 10 MEQ PO TBCR: EXTENDED_RELEASE_TABLET | ORAL | Status: DC

## 2014-08-08 NOTE — Telephone Encounter (Signed)
Pt would like a refill on potassium chloride (K-DUR,KLOR-CON) 10 MEQ tablet. She has an appt scheduled for 08/23/14. Best # [phone]    [facility] [address]

## 2014-08-08 NOTE — Telephone Encounter (Signed)
Called patient left message in voice mail to let her know Dr Audria NineMcPherson cancelled her clinic because of vehicle problems. Advised her to reschedule or go to [facility] walk in clinic.

## 2014-08-23 ENCOUNTER — Ambulatory Visit: Payer: Medicaid Other | Admitting: Family Medicine

## 2014-10-04 ENCOUNTER — Encounter: Payer: Self-pay | Admitting: *Deleted

## 2014-10-12 ENCOUNTER — Telehealth: Payer: Self-pay | Admitting: *Deleted

## 2014-10-12 NOTE — Telephone Encounter (Signed)
Patient phoned & left message in response to my letter.  By the time I was able to return to her call, she had already made an appt with Dr. Gildardo GriffesMac 10/27/14.

## 2014-10-23 ENCOUNTER — Other Ambulatory Visit: Payer: Self-pay | Admitting: Family Medicine

## 2014-10-27 ENCOUNTER — Ambulatory Visit (INDEPENDENT_AMBULATORY_CARE_PROVIDER_SITE_OTHER): Payer: Medicare Other | Admitting: Family Medicine

## 2014-10-27 ENCOUNTER — Encounter: Payer: Self-pay | Admitting: Family Medicine

## 2014-10-27 VITALS — BP 198/90 | HR 80 | Temp 98.7°F | Resp 16 | Wt 241.6 lb

## 2014-10-27 DIAGNOSIS — R609 Edema, unspecified: Secondary | ICD-10-CM

## 2014-10-27 DIAGNOSIS — R7302 Impaired glucose tolerance (oral): Secondary | ICD-10-CM

## 2014-10-27 DIAGNOSIS — L659 Nonscarring hair loss, unspecified: Secondary | ICD-10-CM | POA: Diagnosis not present

## 2014-10-27 DIAGNOSIS — I10 Essential (primary) hypertension: Secondary | ICD-10-CM | POA: Diagnosis not present

## 2014-10-27 LAB — BASIC METABOLIC PANEL
BUN: 12 mg/dL (ref 6–23)
CALCIUM: 9.9 mg/dL (ref 8.4–10.5)
CHLORIDE: 100 meq/L (ref 96–112)
CO2: 27 mEq/L (ref 19–32)
CREATININE: 0.79 mg/dL (ref 0.50–1.10)
Glucose, Bld: 135 mg/dL — ABNORMAL HIGH (ref 70–99)
Potassium: 4.4 mEq/L (ref 3.5–5.3)
Sodium: 138 mEq/L (ref 135–145)

## 2014-10-27 LAB — POCT GLYCOSYLATED HEMOGLOBIN (HGB A1C): HEMOGLOBIN A1C: 7.2

## 2014-10-27 MED ORDER — METOLAZONE 5 MG PO TABS: 5.0000 mg | ORAL_TABLET | Freq: Every day | ORAL | Status: DC

## 2014-10-27 NOTE — Patient Instructions (Signed)
The book that I mentioned- "Eat Fat, Get Thin" by Dr. Bary LericheMark Hymen.   Your A1c is much better and is down to 7.2%.  This is great! I think if you can get this book and follow some of Dr. Bradly ChrisHymen's advice as well as get more active, your will enjoy better overall health.

## 2014-10-30 ENCOUNTER — Other Ambulatory Visit: Payer: Self-pay | Admitting: Family Medicine

## 2014-10-31 NOTE — Progress Notes (Signed)
Subjective:    Patient ID: Nicole Abbott, female    DOB: [birth year], 69 y.o.   MRN: [id number]  HPI This 69 y.o female has HTN; she states home readings 150-160/70-90. She is satisfied with those readings and does not want any other medication adjustments. She no longer takes chlorthalidone as she thinks that med is responsible for hair thinning.  Edema- Pt has chronic ankle edema and needs a diuretic that is not a "thiazide".   Pt has impaired glucose tolerance/metabolism w/ last A1c = 8.1% in April 2015. Pt declines to take medication to lower blood sugar; she has stated that she "really doesn't care at this point". She is trying to monitor nutrition and stay active but has a pessimistic view about her future.  Hair loss- pt is quite upset about thinning hair as oted above. She denies any chemical damage to hair. No family hx of female hair loss.   Patient Active Problem List   Diagnosis Date Noted  . Depression, major, recurrent, moderate 04/06/2014  . Impaired glucose tolerance in obese 10/14/2013  . Obesity, Class III, BMI 40-49.9 (morbid obesity) 03/04/2012  . Chest pain 08/17/2011  . Hyperlipidemia 08/17/2011  . Asthma 08/17/2011  . Hypertension   . Arthritis     Prior to Admission medications   Medication Sig Start Date End Date Taking? Authorizing Provider  conjugated estrogens (PREMARIN) vaginal cream Use 1/4 applicatorful to vaginal area hs. 10/14/13  Yes Maurice MarchBarbara B Vadis Slabach, MD  docusate-casanthranol (PERICOLACE) 100-30 MG per capsule Take 3 capsules by mouth daily.    Yes Historical Provider, MD  Lancets [facility](ONETOUCH ULTRASOFT) lancets Use as instructed 01/26/14  Yes Chelle S Jeffery, PA-C  Multiple Vitamins-Minerals (MULTIVITAMIN WITH MINERALS) tablet Take 1 tablet by mouth daily.   Yes Historical Provider, MD  NAPROXEN SODIUM PO Take by mouth daily.   Yes Historical Provider, MD  Omega-3 Krill Oil 1000 MG CAPS Take 1 capsule by mouth daily after supper. 04/06/14  Yes  Maurice MarchBarbara B Kataleah Bejar, MD  Polyethyl Glycol-Propyl Glycol (SYSTANE OP) Apply to eye 2 (two) times daily.   Yes Historical Provider, MD  potassium chloride (K-DUR,KLOR-CON) 10 MEQ tablet TAKE 1 TABLET BY MOUTH DAILY 08/08/14  Yes Maurice MarchBarbara B Vayla Wilhelmi, MD  ranitidine (ZANTAC) 150 MG tablet Take 1 tablet (150 mg total) by mouth 2 (two) times daily. 04/11/14  Yes Morrell RiddleSarah L Weber, PA-C  Folic Acid-Vit B6-Vit B12 (B COMPLEX-FOLIC ACID) 500-5-200 MCG-MG-MCG TABS Take 1 tablet by mouth 2 (two) times daily. Patient not taking: Reported on 10/27/2014 04/11/14   Maurice MarchBarbara B Yahaira Bruski, MD  Glucose Blood (BLOOD GLUCOSE TEST STRIPS) STRP 1 strip by In Vitro route daily before breakfast. 01/26/14   Chelle S Jeffery, PA-C  glucose blood (ONE TOUCH ULTRA TEST) test strip Test blood sugar once daily before breakfast. Dx code: R73.02 10/24/14   Maurice MarchBarbara B Elba Dendinger, MD  lisinopril (PRINIVIL,ZESTRIL) 40 MG tablet TAKE 1/2 TABLET BY MOUTH TWICE DAILY 10/30/14   Morrell RiddleSarah L Weber, PA-C    Past Surgical History  Procedure Laterality Date  . Tubal ligation    . Cystocele repair    . Colonoscopy    . Dilation and curettage of uterus  62  . Achilles tendon surgery  07/21/2012    Procedure: ACHILLES TENDON REPAIR;  Surgeon: Harvie JuniorJohn L Graves, MD;  Location: [facility];  Service: Orthopedics;  Laterality: Left;   haglun and gastroc resection  . Eye surgery      SOC and FAM HX reviewed.  Review of Systems  Constitutional: Negative.   HENT: Negative.   Eyes: Positive for visual disturbance.       Wears corrective lenses.  Respiratory: Negative for cough, chest tightness, shortness of breath and wheezing.   Cardiovascular: Negative.   Gastrointestinal: Negative.   Endocrine: Negative.   Musculoskeletal: Positive for arthralgias.  Neurological: Negative.   Psychiatric/Behavioral: Negative.        Objective:   Physical Exam  Constitutional: She is oriented to person, place, and time. She appears well-developed and  well-nourished. No distress.  Blood pressure 198/90, pulse 80, temperature 98.7 F (37.1 C), temperature source Oral, resp. rate 16, weight 241 lb 9.6 oz (109.589 kg), SpO2 98 %.   HENT:  Head: Normocephalic and atraumatic.  Right Ear: External ear normal.  Left Ear: External ear normal.  Mouth/Throat: Oropharynx is clear and moist.  Eyes: Conjunctivae and EOM are normal. No scleral icterus.  Neck: Normal range of motion. Neck supple.  Cardiovascular: Normal rate and regular rhythm.   Pulmonary/Chest: Effort normal. No respiratory distress.  Musculoskeletal: Normal range of motion. She exhibits no edema.  Neurological: She is alert and oriented to person, place, and time. No cranial nerve deficit. Coordination normal.  Skin: Skin is warm and dry. She is not diaphoretic.  Psychiatric: She has a normal mood and affect. Her behavior is normal. Judgment and thought content normal.  Nursing note and vitals reviewed.   Results for orders placed or performed in visit on 10/27/14  POCT glycosylated hemoglobin (Hb A1C)  Result Value Ref Range   Hemoglobin A1C 7.2       Assessment & Plan:  Essential hypertension - Pt not inclined to increase current medications. Will continue to monitor at home, address nutrition and weight issues. Plan: Basic metabolic panel, C-reactive protein  Edema - Trial Zaroxolyn 5 mg 1 tablet daily. Plan: C-reactive protein  Impaired glucose tolerance in obese - Pt very pleased that A1c < 8%; she wi;; continue to control glycemic control w/ better nutrition and lifestyle changes. Plan: POCT glycosylated hemoglobin (Hb A1C)  Hair loss disorder- D/C HCTZ. Trial Zaroxolyn as noted above.   Meds ordered this encounter  Medications  . metolazone (ZAROXOLYN) 5 MG tablet    Sig: Take 1 tablet (5 mg total) by mouth daily.    Dispense:  30 tablet    Refill:  3

## 2014-11-12 ENCOUNTER — Ambulatory Visit (INDEPENDENT_AMBULATORY_CARE_PROVIDER_SITE_OTHER): Payer: Medicare Other | Admitting: Emergency Medicine

## 2014-11-12 ENCOUNTER — Ambulatory Visit (INDEPENDENT_AMBULATORY_CARE_PROVIDER_SITE_OTHER): Payer: Medicare Other

## 2014-11-12 VITALS — BP 162/84 | HR 88 | Temp 98.1°F | Resp 16 | Ht 64.0 in | Wt 240.4 lb

## 2014-11-12 DIAGNOSIS — R062 Wheezing: Secondary | ICD-10-CM

## 2014-11-12 DIAGNOSIS — R05 Cough: Secondary | ICD-10-CM | POA: Diagnosis not present

## 2014-11-12 DIAGNOSIS — J189 Pneumonia, unspecified organism: Secondary | ICD-10-CM | POA: Diagnosis not present

## 2014-11-12 DIAGNOSIS — R509 Fever, unspecified: Secondary | ICD-10-CM | POA: Diagnosis not present

## 2014-11-12 DIAGNOSIS — R059 Cough, unspecified: Secondary | ICD-10-CM

## 2014-11-12 LAB — POCT CBC
GRANULOCYTE PERCENT: 69.2 % (ref 37–80)
HCT, POC: 39.5 % (ref 37.7–47.9)
HEMOGLOBIN: 12.8 g/dL (ref 12.2–16.2)
Lymph, poc: 2.4 (ref 0.6–3.4)
MCH, POC: 26.6 pg — AB (ref 27–31.2)
MCHC: 32.4 g/dL (ref 31.8–35.4)
MCV: 82.1 fL (ref 80–97)
MID (cbc): 0.7 (ref 0–0.9)
MPV: 8.3 fL (ref 0–99.8)
POC Granulocyte: 6.9 (ref 2–6.9)
POC LYMPH PERCENT: 24 %L (ref 10–50)
POC MID %: 6.8 %M (ref 0–12)
Platelet Count, POC: 323 10*3/uL (ref 142–424)
RBC: 4.81 M/uL (ref 4.04–5.48)
RDW, POC: 15.9 %
WBC: 9.9 10*3/uL (ref 4.6–10.2)

## 2014-11-12 LAB — POCT RAPID STREP A (OFFICE): RAPID STREP A SCREEN: NEGATIVE

## 2014-11-12 LAB — POCT INFLUENZA A/B
Influenza A, POC: NEGATIVE
Influenza B, POC: NEGATIVE

## 2014-11-12 MED ORDER — AZITHROMYCIN 250 MG PO TABS: ORAL_TABLET | ORAL | Status: DC

## 2014-11-12 MED ORDER — BENZONATATE 100 MG PO CAPS: 100.0000 mg | ORAL_CAPSULE | Freq: Three times a day (TID) | ORAL | Status: DC | PRN

## 2014-11-12 NOTE — Patient Instructions (Signed)

## 2014-11-12 NOTE — Progress Notes (Addendum)
Subjective:  This chart was scribed for Nicole ChrisSteven Primo Innis, MD by Stann Oresung-Kai Tsai, Medical Scribe. This patient was seen in Room 4 and the patient's care was started 12:36 PM.    Patient ID: Nicole Abbott, female    DOB: 1946-01-09, 69 y.o.   MRN: [id number]  HPI Nicole Abbott is a 69 y.o. female who [facility] complaining of gradual onset sore throat that started 3 days ago. Pt stated associated symptoms of congestion, productive coughs, and aching in extremities. She notes not taken flu shot. She was exposed to strep by her great grandson. She has taken Mucinex for chest congestion but feels like it's getting worse.     Review of Systems  DM, last hemoglobin A1C 7.2     Objective:   Physical Exam CONSTITUTIONAL: Well developed/Wel nourished; intially upset due to long wait, tearful at times.  HEAD: Normocephalic/atraumatic EYES: EOMI/PERRL ENMT: Mucous membranes moist; nasal congestion,  NECK: supple no meningeal signs SPINE/BACK: entire spine nontender CV: S1/S2 noted, no murmurs/rubs/gallops noted;  LUNGS: Lungs are clear to auscultation bilaterally, no apparent distress; bilateral expiratory wheezes with rhonchi ABDOMEN: soft, non tender, no rebound or guarding, bowel sounds noted throughout abdomen GU: no cva tenderness NEURO: Pt is awake/alert/appropriate, moves all extremities x4. No facial droop. EXTREMITIES: pulses normal/equal, full ROM SKIN: warm, color normal PSYCH: no abnormalities of mood noted, alert, and oriented to situation [facility] reading (PRIMARY) by  Dr.Jayr Lupercio there appears to be a right lower lobe infiltrate.. Results for orders placed or performed in visit on 11/12/14  POCT rapid strep A  Result Value Ref Range   Rapid Strep A Screen Negative Negative  POCT CBC  Result Value Ref Range   WBC 9.9 4.6 - 10.2 K/uL   Lymph, poc 2.4 0.6 - 3.4   POC LYMPH PERCENT 24.0 10 - 50 %L   MID (cbc) 0.7 0 - 0.9   POC MID % 6.8 0 - 12 %M   POC Granulocyte 6.9 2 -  6.9   Granulocyte percent 69.2 37 - 80 %G   RBC 4.81 4.04 - 5.48 M/uL   Hemoglobin 12.8 12.2 - 16.2 g/dL   HCT, POC 46.939.5 62.937.7 - 47.9 %   MCV 82.1 80 - 97 fL   MCH, POC 26.6 (A) 27 - 31.2 pg   MCHC 32.4 31.8 - 35.4 g/dL   RDW, POC 52.815.9 %   Platelet Count, POC 323 142 - 424 K/uL   MPV 8.3 0 - 99.8 fL   Meds ordered this encounter  Medications  . azithromycin (ZITHROMAX) 250 MG tablet    Sig: Take 2 tabs PO x 1 dose, then 1 tab PO QD x 4 days    Dispense:  6 tablet    Refill:  0  . benzonatate (TESSALON) 100 MG capsule    Sig: Take 1-2 capsules (100-200 mg total) by mouth 3 (three) times daily as needed for cough.    Dispense:  40 capsule    Refill:  0        Assessment & Plan:   1. Fever, unspecified fever cause  - POCT rapid strep A - POCT Influenza A/B - CBC - POCT CBC  2. Cough The radiologist did not feel there was an infiltrate in the right lower lobe. Will treat as a bronchitis with Zithromax.  3. Wheezing  - DG Chest 2 View; Future   I personally performed the services described in this documentation, which was scribed in my presence.  The recorded information has been reviewed and is accurate.  Nicole ChrisSteven Sherika Kubicki, MD  [facility], [facility]  11/12/2014 1:20 PM

## 2015-01-22 ENCOUNTER — Telehealth: Payer: Self-pay | Admitting: Family Medicine

## 2015-01-22 ENCOUNTER — Ambulatory Visit: Payer: Medicare Other | Admitting: Family Medicine

## 2015-01-22 NOTE — Telephone Encounter (Signed)
LMOM to CB to reschedule appt.

## 2015-01-28 ENCOUNTER — Other Ambulatory Visit: Payer: Self-pay | Admitting: Physician Assistant

## 2015-02-07 ENCOUNTER — Other Ambulatory Visit: Payer: Self-pay | Admitting: Physician Assistant

## 2015-03-08 ENCOUNTER — Other Ambulatory Visit: Payer: Self-pay | Admitting: Physician Assistant

## 2015-03-09 ENCOUNTER — Other Ambulatory Visit: Payer: Self-pay | Admitting: Physician Assistant

## 2015-03-09 NOTE — Telephone Encounter (Signed)
[facility] is calling because the request for lisinopril was denied and the patient needs enough to last until her next appointment.

## 2015-03-12 ENCOUNTER — Other Ambulatory Visit: Payer: Self-pay | Admitting: Physician Assistant

## 2015-03-15 ENCOUNTER — Encounter: Payer: Self-pay | Admitting: Family Medicine

## 2015-03-15 ENCOUNTER — Ambulatory Visit (INDEPENDENT_AMBULATORY_CARE_PROVIDER_SITE_OTHER): Payer: Medicare Other | Admitting: Family Medicine

## 2015-03-15 VITALS — BP 192/74 | HR 80 | Temp 98.6°F | Resp 16 | Ht 64.0 in | Wt 242.0 lb

## 2015-03-15 DIAGNOSIS — F331 Major depressive disorder, recurrent, moderate: Secondary | ICD-10-CM | POA: Diagnosis not present

## 2015-03-15 DIAGNOSIS — I10 Essential (primary) hypertension: Secondary | ICD-10-CM

## 2015-03-15 DIAGNOSIS — E785 Hyperlipidemia, unspecified: Secondary | ICD-10-CM | POA: Diagnosis not present

## 2015-03-15 DIAGNOSIS — R7302 Impaired glucose tolerance (oral): Secondary | ICD-10-CM | POA: Diagnosis not present

## 2015-03-15 DIAGNOSIS — E119 Type 2 diabetes mellitus without complications: Secondary | ICD-10-CM | POA: Diagnosis not present

## 2015-03-15 LAB — COMPREHENSIVE METABOLIC PANEL
ALK PHOS: 69 U/L (ref 33–130)
ALT: 25 U/L (ref 6–29)
AST: 29 U/L (ref 10–35)
Albumin: 4.1 g/dL (ref 3.6–5.1)
BUN: 12 mg/dL (ref 7–25)
CO2: 28 mmol/L (ref 20–31)
Calcium: 9.6 mg/dL (ref 8.6–10.4)
Chloride: 101 mmol/L (ref 98–110)
Creat: 0.74 mg/dL (ref 0.60–0.93)
GLUCOSE: 91 mg/dL (ref 65–99)
POTASSIUM: 4.1 mmol/L (ref 3.5–5.3)
SODIUM: 142 mmol/L (ref 135–146)
Total Bilirubin: 0.3 mg/dL (ref 0.2–1.2)
Total Protein: 7.3 g/dL (ref 6.1–8.1)

## 2015-03-15 LAB — LDL CHOLESTEROL, DIRECT: Direct LDL: 144 mg/dL — ABNORMAL HIGH (ref <130)

## 2015-03-15 MED ORDER — LISINOPRIL 40 MG PO TABS: 20.0000 mg | ORAL_TABLET | Freq: Two times a day (BID) | ORAL | Status: DC

## 2015-03-15 MED ORDER — SPIRONOLACTONE 25 MG PO TABS: 25.0000 mg | ORAL_TABLET | Freq: Every day | ORAL | Status: DC

## 2015-03-15 NOTE — Progress Notes (Signed)
Subjective:    Patient ID: Nicole Abbott, female    DOB: Jan 05, 1946, 69 y.o.   MRN: [id number] Chief Complaint  Patient presents with  . Medication Refill    HPI    Pt informed me immed that today's BP guidelines are to low for her - she doesn't feel good when her bp gets to be 150-165s/90s. Checks bp at home and remains at 170/90s which is where she wants it - aware of the risk of untreated HTN.  Will take her lisinopril but nothing else. pt wants life quality, not quantity. Even if she got cancer she is not going to do anything about it, would not be interested in trying any trx. Pt feels that she is very astute about her medical condition and does not need any guidance.  Also checks on blood sugars regularly but does not check them every day. She does not believe that she has diabetes.  Pt is unwilling to ever take a statin. She states she is unwilling to make any sig diet changes - feels like she should be able to eat what she wants at 47 and figures she has to die of something.  She has to have naproxin for her knees even though she is aware of it effects on CAD.  Her Vit D has been fine  She is not taking the zaroxyln very often as it is "a thiazide" that takes her hair out - worst done by hctz. But she still wants to leave the zaroxyln on her list.  Takes K only with the zaroxyln.  Was on lasix prior which helped with her pedal edema but did not make any sig improvement in her BP but might be interested in switching back if there aren't any other good options.  Pt worked as a Engineer, civil (consulting) for decades - largely in [place].  Past Medical History  Diagnosis Date  . Hypertension   . Arthritis   . Asthma     as child  . Chronic constipation   . GERD (gastroesophageal reflux disease)   . Wears glasses    Past Surgical History  Procedure Laterality Date  . Tubal ligation    . Cystocele repair    . Colonoscopy    . Dilation and curettage of uterus  62  . Achilles tendon surgery   07/21/2012    Procedure: ACHILLES TENDON REPAIR;  Surgeon: Harvie Junior, MD;  Location: [facility];  Service: Orthopedics;  Laterality: Left;   haglun and gastroc resection  . Eye surgery     Current Outpatient Prescriptions on File Prior to Visit  Medication Sig Dispense Refill  . docusate-casanthranol (PERICOLACE) 100-30 MG per capsule Take 3 capsules by mouth daily.     . Glucose Blood (BLOOD GLUCOSE TEST STRIPS) STRP 1 strip by In Vitro route daily before breakfast. 100 each 3  . glucose blood (ONE TOUCH ULTRA TEST) test strip Test blood sugar once daily before breakfast. Dx code: R73.02 100 each 1  . Lancets (ONETOUCH ULTRASOFT) lancets Use as instructed 100 each 12  . metolazone (ZAROXOLYN) 5 MG tablet Take 1 tablet (5 mg total) by mouth daily. 30 tablet 3  . Multiple Vitamins-Minerals (MULTIVITAMIN WITH MINERALS) tablet Take 1 tablet by mouth daily.    Marland Kitchen NAPROXEN SODIUM PO Take by mouth daily.    Bertram Gala Glycol-Propyl Glycol (SYSTANE OP) Apply to eye 2 (two) times daily.    . potassium chloride (K-DUR,KLOR-CON) 10 MEQ tablet TAKE 1 TABLET  BY MOUTH DAILY 90 tablet 0   No current facility-administered medications on file prior to visit.   Allergies  Allergen Reactions  . Penicillins Anaphylaxis  . Bystolic [Nebivolol Hcl] Other (See Comments)    Headache   . Codeine     Causes ileus   Family History  Problem Relation Age of Onset  . Diabetes Mother   . Hypertension Mother   . Diabetes Sister   . Hypertension Sister   . Thyroid disease Sister   . Thyroid disease Brother   . Asthma Son   . Asthma Grandchild   . Asthma Grandchild   . Asthma Grandchild    Social History   Social History  . Marital Status: Divorced    Spouse Name: N/A  . Number of Children: N/A  . Years of Education: N/A   Occupational History  . Retired-Licensed Practical Nurse     Retired   Social History Main Topics  . Smoking status: Former Smoker    Quit date: 06/30/1984    . Smokeless tobacco: None  . Alcohol Use: Yes     Comment: Occasional wine drinker  . Drug Use: No  . Sexual Activity: Not Currently    Birth Control/ Protection: Abstinence   Other Topics Concern  . None   Social History Narrative   Exercises very little.     Review of Systems  Constitutional: Positive for fatigue. Negative for fever, chills, diaphoresis and appetite change.  Eyes: Negative for visual disturbance.  Respiratory: Negative for cough and shortness of breath.   Cardiovascular: Negative for chest pain, palpitations and leg swelling.  Genitourinary: Negative for decreased urine volume.  Musculoskeletal: Positive for arthralgias. Negative for gait problem.  Neurological: Negative for syncope and headaches.  Hematological: Does not bruise/bleed easily.       Objective:  BP 192/74 mmHg  Pulse 80  Temp(Src) 98.6 F (37 C)  Resp 16  Ht 5\' 4"  (1.626 m)  Wt 242 lb (109.77 kg)  BMI 41.52 kg/m2  Physical Exam  Constitutional: She is oriented to person, place, and time. She appears well-developed and well-nourished. No distress.  HENT:  Head: Normocephalic and atraumatic.  Right Ear: External ear normal.  Left Ear: External ear normal.  Eyes: Conjunctivae are normal. No scleral icterus.  Neck: Normal range of motion. Neck supple. No thyromegaly present.  Cardiovascular: Normal rate, regular rhythm, normal heart sounds and intact distal pulses.   Pulmonary/Chest: Effort normal and breath sounds normal. No respiratory distress.  Musculoskeletal: She exhibits no edema.  Lymphadenopathy:    She has no cervical adenopathy.  Neurological: She is alert and oriented to person, place, and time.  Skin: Skin is warm and dry. She is not diaphoretic. No erythema.  Psychiatric: She has a normal mood and affect. Her behavior is normal.          Assessment & Plan:    1. Essential hypertension - cont lisinopril 40. Pt adamant that BP better at home and she under no  condition would like to lower her BP to the nml geriatric goal of 140-150.  She has been using prn zarolxyn very sparingly but would like to try a non-thiazide diuretic as hctz caused hair loss. Was on lasix prior with effect on pedal edema but did not help bp sig per pt.  Will try her on spironolactone - if this is working for her 90d supply w/ 2 refills on the aldactone.  If pt does not tol spironolactone, ok to switch back  to prn zaroxolyn.  2. Hyperlipidemia - pt adament that she will never take a statin but req that her lipids be checked today as if they were severely worsened she may consider some diet changes.  3. Obesity, Class III, BMI 40-49.9 (morbid obesity)   4. Impaired glucose tolerance in obese - pt has well documented type II DM with last a1c 7.2 -> 8.0 today but she refuses to acknowledge this and has reconciled herself to having some "glucose intolerance" only so refuses to take any meds. Following generally health diet but no restricting much if she has the desire to eat unhealthy.  5. Depression, major, recurrent, moderate   6. Diabetes mellitus without complication   Recheck 1 yr Pt req that her labs be sent to her but she will not consider changing/starting any meds so requessts that I do not discuss this with her.  Orders Placed This Encounter  Procedures  . Comprehensive metabolic panel  . Hemoglobin A1c  . LDL Cholesterol, Direct  . TSH    Meds ordered this encounter  Medications  . lisinopril (PRINIVIL,ZESTRIL) 40 MG tablet    Sig: Take 0.5 tablets (20 mg total) by mouth 2 (two) times daily.    Dispense:  90 tablet    Refill:  3  . spironolactone (ALDACTONE) 25 MG tablet    Sig: Take 1 tablet (25 mg total) by mouth daily.    Dispense:  30 tablet    Refill:  2    Norberto Sorenson, MD MPH  Results for orders placed or performed in visit on 03/15/15  Comprehensive metabolic panel  Result Value Ref Range   Sodium 142 135 - 146 mmol/L   Potassium 4.1 3.5 - 5.3 mmol/L     Chloride 101 98 - 110 mmol/L   CO2 28 20 - 31 mmol/L   Glucose, Bld 91 65 - 99 mg/dL   BUN 12 7 - 25 mg/dL   Creat 1.61 0.96 - 0.45 mg/dL   Total Bilirubin 0.3 0.2 - 1.2 mg/dL   Alkaline Phosphatase 69 33 - 130 U/L   AST 29 10 - 35 U/L   ALT 25 6 - 29 U/L   Total Protein 7.3 6.1 - 8.1 g/dL   Albumin 4.1 3.6 - 5.1 g/dL   Calcium 9.6 8.6 - 40.9 mg/dL  Hemoglobin W1X  Result Value Ref Range   Hgb A1c MFr Bld 8.0 (H) <5.7 %   Mean Plasma Glucose 183 (H) <117 mg/dL  LDL Cholesterol, Direct  Result Value Ref Range   Direct LDL 144 (H) <130 mg/dL  TSH  Result Value Ref Range   TSH 2.454 0.350 - 4.500 uIU/mL

## 2015-03-16 LAB — TSH: TSH: 2.454 u[IU]/mL (ref 0.350–4.500)

## 2015-03-16 LAB — HEMOGLOBIN A1C
Hgb A1c MFr Bld: 8 % — ABNORMAL HIGH (ref <5.7)
Mean Plasma Glucose: 183 mg/dL — ABNORMAL HIGH (ref <117)

## 2015-04-04 ENCOUNTER — Telehealth: Payer: Self-pay | Admitting: Family Medicine

## 2015-04-04 NOTE — Telephone Encounter (Signed)
Spoke with patient and asked her if she had her mammogram and if not could we get it set up.  She is refusing it at this time.

## 2015-05-25 ENCOUNTER — Ambulatory Visit (INDEPENDENT_AMBULATORY_CARE_PROVIDER_SITE_OTHER): Payer: Medicare Other | Admitting: Family Medicine

## 2015-05-25 ENCOUNTER — Encounter: Payer: Self-pay | Admitting: Family Medicine

## 2015-05-25 ENCOUNTER — Other Ambulatory Visit: Payer: Self-pay | Admitting: Family Medicine

## 2015-05-25 VITALS — BP 210/113 | HR 86 | Temp 98.7°F | Resp 16 | Ht 65.0 in | Wt 239.2 lb

## 2015-05-25 DIAGNOSIS — I10 Essential (primary) hypertension: Secondary | ICD-10-CM | POA: Diagnosis not present

## 2015-05-25 DIAGNOSIS — M5010 Cervical disc disorder with radiculopathy, unspecified cervical region: Secondary | ICD-10-CM

## 2015-05-25 DIAGNOSIS — E119 Type 2 diabetes mellitus without complications: Secondary | ICD-10-CM | POA: Diagnosis not present

## 2015-05-25 DIAGNOSIS — R030 Elevated blood-pressure reading, without diagnosis of hypertension: Secondary | ICD-10-CM

## 2015-05-25 MED ORDER — TIZANIDINE HCL 2 MG PO TABS: 2.0000 mg | ORAL_TABLET | Freq: Four times a day (QID) | ORAL | Status: DC | PRN

## 2015-05-25 MED ORDER — SULINDAC 150 MG PO TABS: 150.0000 mg | ORAL_TABLET | Freq: Two times a day (BID) | ORAL | Status: DC

## 2015-05-25 MED ORDER — KETOROLAC TROMETHAMINE 60 MG/2ML IM SOLN
60.0000 mg | Freq: Once | INTRAMUSCULAR | Status: AC
  Administered 2015-05-25: 60 mg via INTRAMUSCULAR

## 2015-05-25 NOTE — Patient Instructions (Addendum)
Continue to monitor your blood pressure at home twice a day. If it is not coming down back to normal, please call so we can modify your medications. If you develop any chest pain or anginal equivalent, please call 911 immediately. Remember that you are at very high risk for having atypical unexpected symptoms for angina or during acute coronary syndrome/cardiac ischemia.  I put in an urgent referral to physical therapy. We can try the zanaflex (tizanadine) for a muscle relaxant. I sent sulindac which is a more potent NSAID to use for very short term with the zanaflex if it is not working a lone. Ok to use with tylenol but not naproxen/ibuprofen of course.  Cervical Radiculopathy Cervical radiculopathy happens when a nerve in the neck (cervical nerve) is pinched or bruised. This condition can develop because of an injury or as part of the normal aging process. Pressure on the cervical nerves can cause pain or numbness that runs from the neck all the way down into the arm and fingers. Usually, this condition gets better with rest. Treatment may be needed if the condition does not improve.  CAUSES This condition may be caused by:  Injury.  Slipped (herniated) disk.  Muscle tightness in the neck because of overuse.  Arthritis.  Breakdown or degeneration in the bones and joints of the spine (spondylosis) due to aging.  Bone spurs that may develop near the cervical nerves. SYMPTOMS Symptoms of this condition include:  Pain that runs from the neck to the arm and hand. The pain can be severe or irritating. It may be worse when the neck is moved.  Numbness or weakness in the affected arm and hand. DIAGNOSIS This condition may be diagnosed based on symptoms, medical history, and a physical exam. You may also have tests, including:  X-rays.  CT scan.  MRI.  Electromyogram (EMG).  Nerve conduction tests. TREATMENT In many cases, treatment is not needed for this condition. With rest, the  condition usually gets better over time. If treatment is needed, options may include:  Wearing a soft neck collar for short periods of time.  Physical therapy to strengthen your neck muscles.  Medicines, such as NSAIDs, oral corticosteroids, or spinal injections.  Surgery. This may be needed if other treatments do not help. Various types of surgery may be done depending on the cause of your problems. HOME CARE INSTRUCTIONS Managing Pain  Take over-the-counter and prescription medicines only as told by your health care provider.  If directed, apply ice to the affected area.  Put ice in a plastic bag.  Place a towel between your skin and the bag.  Leave the ice on for 20 minutes, 2-3 times per day.  If ice does not help, you can try using heat. Take a warm shower or warm bath, or use a heat pack as told by your health care provider.  Try a gentle neck and shoulder massage to help relieve symptoms. Activity  Rest as needed. Follow instructions from your health care provider about any restrictions on activities.  Do stretching and strengthening exercises as told by your health care provider or physical therapist. General Instructions  If you were given a soft collar, wear it as told by your health care provider.  Use a flat pillow when you sleep.  Keep all follow-up visits as told by your health care provider. This is important. SEEK MEDICAL CARE IF:  Your condition does not improve with treatment. SEEK IMMEDIATE MEDICAL CARE IF:  Your pain gets  much worse and cannot be controlled with medicines.  You have weakness or numbness in your hand, arm, face, or leg.  You have a high fever.  You have a stiff, rigid neck.  You lose control of your bowels or your bladder (have incontinence).  You have trouble with walking, balance, or speaking.   This information is not intended to replace advice given to you by your health care provider. Make sure you discuss any questions  you have with your health care provider.   Document Released: 03/11/2001 Document Revised: 03/07/2015 Document Reviewed: 08/10/2014 Elsevier Interactive Patient Education [employer]2016 Elsevier Inc.
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
## 2015-05-26 ENCOUNTER — Telehealth: Payer: Self-pay

## 2015-05-26 MED ORDER — METHOCARBAMOL 500 MG PO TABS: 500.0000 mg | ORAL_TABLET | Freq: Four times a day (QID) | ORAL | Status: DC | PRN

## 2015-05-26 NOTE — Telephone Encounter (Signed)
I think zanaflex is the only muscle relaxer on her ins plan. Sent in [place]ocar[place]ol-robaxin which is usually much better tolerated - without insurance she can get it for [facility].00 at [facility] or [facility] if she brings in the corresponding free coupon she can print or download at [email or web address].  We can fill out a PA likely to get her this med through her insurance but those will often take sev d to go through. I did send the [facility]ol to [facility] if she wants to go see how much it is going to be through her insurance first. . ..

## 2015-05-26 NOTE — Telephone Encounter (Signed)
Patient called in and stated that she can not take the tiZANidine (ZANAFLEX) 2 MG tablet because it is affecting her stomach and making it hurt, she would like something else to help with these muscle spasm.   [facility] uses the [facility] on [address] and her call back number is  [phone]

## 2015-05-28 ENCOUNTER — Other Ambulatory Visit: Payer: Self-pay

## 2015-05-28 ENCOUNTER — Telehealth: Payer: Self-pay

## 2015-05-28 MED ORDER — METHOCARBAMOL 500 MG PO TABS: 500.0000 mg | ORAL_TABLET | Freq: Four times a day (QID) | ORAL | Status: DC | PRN

## 2015-05-28 NOTE — Telephone Encounter (Signed)
Advised pt message. 

## 2015-05-28 NOTE — Telephone Encounter (Signed)
[facility]

## 2015-05-28 NOTE — Telephone Encounter (Signed)
Check message from 11/26. Pt would like us  to send her     To [facility] on [address] She can get her script cheaper there. Please advise at [phone]  methocarbamol (ROBAXIN) 500 MG tablet [[id number]][[id number]]

## 2015-05-30 ENCOUNTER — Telehealth: Payer: Self-pay

## 2015-05-30 NOTE — Telephone Encounter (Signed)
Pt wants Dr. Clelia CroftShaw to know that [facility] and found out-her insurance company excepts Dr[facility]el at [facility]. She would like a referral process to start. Please advise at [phone]

## 2015-06-01 DIAGNOSIS — H25013 Cortical age-related cataract, bilateral: Secondary | ICD-10-CM | POA: Diagnosis not present

## 2015-06-01 DIAGNOSIS — E119 Type 2 diabetes mellitus without complications: Secondary | ICD-10-CM | POA: Diagnosis not present

## 2015-06-01 DIAGNOSIS — H2513 Age-related nuclear cataract, bilateral: Secondary | ICD-10-CM | POA: Diagnosis not present

## 2015-06-01 LAB — HM DIABETES EYE EXAM

## 2015-06-01 NOTE — Telephone Encounter (Signed)
Going to need more specifics - such as what she needs a referral for and what type of physician is Dr. Allena KatzPatel - there are a lot. At last visit I referred pt to PT - if that is what she is calling in re to than please pass along the message to the referral staff.

## 2015-06-04 ENCOUNTER — Ambulatory Visit (INDEPENDENT_AMBULATORY_CARE_PROVIDER_SITE_OTHER): Payer: Medicare Other

## 2015-06-04 ENCOUNTER — Ambulatory Visit (INDEPENDENT_AMBULATORY_CARE_PROVIDER_SITE_OTHER): Payer: Medicare Other | Admitting: Family Medicine

## 2015-06-04 VITALS — BP 168/88 | HR 82 | Temp 98.4°F | Resp 16 | Ht 65.0 in

## 2015-06-04 DIAGNOSIS — M5412 Radiculopathy, cervical region: Secondary | ICD-10-CM

## 2015-06-04 DIAGNOSIS — M542 Cervicalgia: Secondary | ICD-10-CM

## 2015-06-04 DIAGNOSIS — I10 Essential (primary) hypertension: Secondary | ICD-10-CM | POA: Diagnosis not present
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
## 2015-06-04 NOTE — Telephone Encounter (Signed)
Left message for pt to call back  °

## 2015-06-07 ENCOUNTER — Other Ambulatory Visit: Payer: Self-pay

## 2015-06-07 MED ORDER — POTASSIUM CHLORIDE CRYS ER 10 MEQ PO TBCR: EXTENDED_RELEASE_TABLET | ORAL | Status: DC

## 2015-06-12 DIAGNOSIS — M542 Cervicalgia: Secondary | ICD-10-CM | POA: Diagnosis not present

## 2015-06-12 DIAGNOSIS — M50323 Other cervical disc degeneration at C6-C7 level: Secondary | ICD-10-CM | POA: Diagnosis not present

## 2015-06-12 DIAGNOSIS — M50123 Cervical disc disorder at C6-C7 level with radiculopathy: Secondary | ICD-10-CM | POA: Diagnosis not present

## 2015-06-13 ENCOUNTER — Encounter: Payer: Self-pay | Admitting: Family Medicine

## 2015-06-26 DIAGNOSIS — M542 Cervicalgia: Secondary | ICD-10-CM | POA: Diagnosis not present

## 2015-07-03 DIAGNOSIS — M50123 Cervical disc disorder at C6-C7 level with radiculopathy: Secondary | ICD-10-CM | POA: Diagnosis not present

## 2015-07-03 DIAGNOSIS — M542 Cervicalgia: Secondary | ICD-10-CM | POA: Diagnosis not present

## 2015-07-03 DIAGNOSIS — M50323 Other cervical disc degeneration at C6-C7 level: Secondary | ICD-10-CM | POA: Diagnosis not present

## 2015-07-17 DIAGNOSIS — H25012 Cortical age-related cataract, left eye: Secondary | ICD-10-CM | POA: Diagnosis not present

## 2015-07-17 DIAGNOSIS — H2512 Age-related nuclear cataract, left eye: Secondary | ICD-10-CM | POA: Diagnosis not present

## 2015-07-17 DIAGNOSIS — H25812 Combined forms of age-related cataract, left eye: Secondary | ICD-10-CM | POA: Diagnosis not present

## 2015-08-22 DIAGNOSIS — M452 Ankylosing spondylitis of cervical region: Secondary | ICD-10-CM | POA: Diagnosis not present

## 2015-08-22 DIAGNOSIS — R0602 Shortness of breath: Secondary | ICD-10-CM | POA: Diagnosis not present

## 2015-08-22 DIAGNOSIS — H209 Unspecified iridocyclitis: Secondary | ICD-10-CM | POA: Diagnosis not present

## 2015-08-22 DIAGNOSIS — R5383 Other fatigue: Secondary | ICD-10-CM | POA: Diagnosis not present

## 2015-08-22 DIAGNOSIS — M542 Cervicalgia: Secondary | ICD-10-CM | POA: Diagnosis not present

## 2015-08-28 ENCOUNTER — Telehealth: Payer: Self-pay

## 2015-08-28 NOTE — Telephone Encounter (Signed)
Pt is needing to talk with dr Clelia Croft about what dr Lendon Colonel the rhematologist and prescibing three antibodics   Best number [phone]

## 2015-08-29 NOTE — Telephone Encounter (Signed)
Dr. Shaw  Please see previous message 

## 2015-08-30 NOTE — Telephone Encounter (Signed)
There is nothing in this chart???  Is this a duplicate chart?  I do not remember who this pt is or in what this is in reference to. Please find actual chart and open the encounter in there. and then call pt to find out what she wants [place] to know.

## 2015-09-03 ENCOUNTER — Ambulatory Visit (INDEPENDENT_AMBULATORY_CARE_PROVIDER_SITE_OTHER): Payer: Medicare Other | Admitting: Family Medicine

## 2015-09-03 ENCOUNTER — Ambulatory Visit: Payer: Self-pay

## 2015-09-03 VITALS — BP 162/88 | HR 82 | Temp 98.6°F | Resp 18 | Wt 237.4 lb

## 2015-09-03 DIAGNOSIS — J4521 Mild intermittent asthma with (acute) exacerbation: Secondary | ICD-10-CM | POA: Diagnosis not present

## 2015-09-03 MED ORDER — ALBUTEROL SULFATE 108 (90 BASE) MCG/ACT IN AEPB: 2.0000 | INHALATION_SPRAY | Freq: Four times a day (QID) | RESPIRATORY_TRACT | Status: AC | PRN

## 2015-09-03 NOTE — Patient Instructions (Signed)
Call me if you are not improving [phone]

## 2015-09-03 NOTE — Progress Notes (Signed)
   Subjective:    Patient ID: Nicole Abbott, female    DOB: 12-29-1945, 70 y.o.   MRN: [id number] By signing my name below, I, Nicole Abbott, attest that this documentation has been prepared under the direction and in the presence of Elvina SidleKurt Khadim Lundberg, MD.  Electronically Signed: Littie Deedsichard Abbott, Medical Scribe. 09/03/2015. 1:31 PM.  HPI HPI Comments: Nicole BorgKathleen Y Abbott is a 70 y.o. female with a history of asthma (in childhood), arthritis, and DM (diet controlled) who presents to the [facility] complaining of gradual onset SOB that started about 1 month ago. The SOB is described as a dyspnea. She reports having associated wheezing. Patient has also developed a cough recently. She had a CXR done with Dr. Nickola MajorHawkes, her rheumatology. She was put on azithromycin for 3 days by Dr. Nickola MajorHawkes. She had been sent to rheumatology by Dr. Ethelene Halamos to rule out rheumatoid arthritis due to abnormal MRI. Patient denies fever, ankle swelling, and chest pain. She has a remote history of pneumonia.  PCP: Dr. Clelia CroftShaw  Patient is retired.  Review of Systems  Constitutional: Negative for fever.  Respiratory: Positive for cough, shortness of breath and wheezing.   Cardiovascular: Negative for chest pain and leg swelling.  Musculoskeletal: Negative for joint swelling.       Objective:   Physical Exam CONSTITUTIONAL: Well developed/well nourished HEAD: Normocephalic/atraumatic EYES: EOM/PERRL ENMT: Mucous membranes moist NECK: supple no meningeal signs SPINE: entire spine nontender CV: S1/S2 noted, no murmurs/rubs/gallops noted LUNGS: Expiratory wheezes bilaterally ABDOMEN: soft, nontender, no rebound or guarding GU: no cva tenderness NEURO: Pt is awake/alert, moves all extremitiesx4 EXTREMITIES: pulses normal, full ROM, no edema SKIN: warm, color normal PSYCH: no abnormalities of mood noted       Assessment & Plan:   This chart was scribed in my presence and reviewed by me personally.   ICD-9-CM ICD-10-CM   1. Asthma with acute exacerbation, mild intermittent 493.92 J45.21 Albuterol Sulfate (PROAIR RESPICLICK) 108 (90 Base) MCG/ACT AEPB     Signed, Elvina SidleKurt Cherisse Carrell, MD

## 2015-09-04 DIAGNOSIS — M50323 Other cervical disc degeneration at C6-C7 level: Secondary | ICD-10-CM | POA: Diagnosis not present

## 2015-09-04 DIAGNOSIS — M542 Cervicalgia: Secondary | ICD-10-CM | POA: Diagnosis not present

## 2015-09-04 DIAGNOSIS — M50123 Cervical disc disorder at C6-C7 level with radiculopathy: Secondary | ICD-10-CM | POA: Diagnosis not present

## 2015-09-12 DIAGNOSIS — M50323 Other cervical disc degeneration at C6-C7 level: Secondary | ICD-10-CM | POA: Diagnosis not present

## 2015-09-24 DIAGNOSIS — M2550 Pain in unspecified joint: Secondary | ICD-10-CM | POA: Diagnosis not present

## 2015-09-24 DIAGNOSIS — H209 Unspecified iridocyclitis: Secondary | ICD-10-CM | POA: Diagnosis not present

## 2015-09-24 DIAGNOSIS — M452 Ankylosing spondylitis of cervical region: Secondary | ICD-10-CM | POA: Diagnosis not present

## 2015-09-24 DIAGNOSIS — R0602 Shortness of breath: Secondary | ICD-10-CM | POA: Diagnosis not present

## 2015-11-06 DIAGNOSIS — H25811 Combined forms of age-related cataract, right eye: Secondary | ICD-10-CM | POA: Diagnosis not present

## 2015-11-06 DIAGNOSIS — H2511 Age-related nuclear cataract, right eye: Secondary | ICD-10-CM | POA: Diagnosis not present

## 2015-11-06 DIAGNOSIS — H25011 Cortical age-related cataract, right eye: Secondary | ICD-10-CM | POA: Diagnosis not present

## 2015-11-13 ENCOUNTER — Other Ambulatory Visit: Payer: Self-pay

## 2016-01-29 ENCOUNTER — Other Ambulatory Visit: Payer: Self-pay | Admitting: Family Medicine

## 2016-01-31 ENCOUNTER — Encounter: Payer: Self-pay | Admitting: Physician Assistant

## 2016-01-31 ENCOUNTER — Ambulatory Visit (INDEPENDENT_AMBULATORY_CARE_PROVIDER_SITE_OTHER): Payer: Medicare Other | Admitting: Physician Assistant

## 2016-01-31 ENCOUNTER — Ambulatory Visit (INDEPENDENT_AMBULATORY_CARE_PROVIDER_SITE_OTHER): Payer: Medicare Other

## 2016-01-31 VITALS — BP 150/100 | HR 97 | Temp 98.8°F | Resp 18 | Ht 64.0 in | Wt 242.0 lb

## 2016-01-31 DIAGNOSIS — R0602 Shortness of breath: Secondary | ICD-10-CM

## 2016-01-31 DIAGNOSIS — E119 Type 2 diabetes mellitus without complications: Secondary | ICD-10-CM

## 2016-01-31 DIAGNOSIS — I10 Essential (primary) hypertension: Secondary | ICD-10-CM | POA: Diagnosis not present

## 2016-01-31 DIAGNOSIS — J4520 Mild intermittent asthma, uncomplicated: Secondary | ICD-10-CM | POA: Diagnosis not present

## 2016-01-31 LAB — POCT CBC
Granulocyte percent: 48.6 %G (ref 37–80)
HEMATOCRIT: 36.3 % — AB (ref 37.7–47.9)
Hemoglobin: 12.7 g/dL (ref 12.2–16.2)
LYMPH, POC: 2.3 (ref 0.6–3.4)
MCH, POC: 27.7 pg (ref 27–31.2)
MCHC: 35 g/dL (ref 31.8–35.4)
MCV: 79.1 fL — AB (ref 80–97)
MID (cbc): 0.4 (ref 0–0.9)
MPV: 7.9 fL (ref 0–99.8)
POC GRANULOCYTE: 2.6 (ref 2–6.9)
POC LYMPH %: 43.4 % (ref 10–50)
POC MID %: 8 %M (ref 0–12)
Platelet Count, POC: 287 10*3/uL (ref 142–424)
RBC: 4.58 M/uL (ref 4.04–5.48)
RDW, POC: 14.9 %
WBC: 5.3 10*3/uL (ref 4.6–10.2)

## 2016-01-31 LAB — POCT GLYCOSYLATED HEMOGLOBIN (HGB A1C): HEMOGLOBIN A1C: 7.3

## 2016-01-31 MED ORDER — PREDNISONE 20 MG PO TABS: 20.0000 mg | ORAL_TABLET | Freq: Every day | ORAL | 0 refills | Status: DC

## 2016-01-31 MED ORDER — METOLAZONE 5 MG PO TABS: 5.0000 mg | ORAL_TABLET | Freq: Every day | ORAL | 5 refills | Status: DC

## 2016-01-31 NOTE — Progress Notes (Signed)
[facility] [address], [place] [address] [phone]  Date:  01/31/2016   Name:  Nicole Abbott   DOB:  [birth year]   MRN:  [id number]  PCP:  Norberto Sorenson, MD    Chief Complaint: Medication Refill (metolazone) and Shortness of Breath (about a week)   History of Present Illness:  This is a 70 y.o. female with PMH DM2, HTN, HLD, asthma who is presenting with sob and wheezing x 1 week. Feels overall that her symptoms are better than when they started. She was wheezing a lot initially, that seems to have calmed down some. Chest feels tight. Not coughing, only if she tries to breath deep. States she feels "weak and lethargic". Pt had asthma as a child but grew out of it. She was seen here 5 months ago with her first asthma flare in decades. This feels similar. She is very worried that this is going to become a chronic problem.  Has been using albuterol every 6 hours for the past week and gets relief. Denies cp, sore throat, new nasal congestion, fever or chills.  No problems with her heart as far as she knows. She has uncontrolled HTN but only takes her BP meds intermittently. States she waits until she has gained about 4 pounds and she can tell that her feet and hands are swollen and she will take her metolazone. She is a retired [employer] fundraiser. States "I'm going to die someday, I'm not going to waste my time worrying about medications". She does note that when she gains a few pounds she notices it is more difficult to breath. Never been dx'd with CHF. She reports she did have rheumatic fever as a child and she was told by doctors that she would get CHF but no problems so far.  She needs a refill of her metolazone today.  She has not had her a1C in 8 months. Last at 8.0. She does not wish to take medicine for this. She does not check her BG.  Review of Systems:  Review of Systems See HPI  Patient Active Problem List   Diagnosis Date Noted  . Depression, major, recurrent,  moderate (HCC) 04/06/2014  . Impaired glucose tolerance in obese 10/14/2013  . Obesity, Class III, BMI 40-49.9 (morbid obesity) (HCC) 03/04/2012  . Chest pain 08/17/2011  . Hyperlipidemia 08/17/2011  . Asthma 08/17/2011  . Hypertension   . Arthritis     Prior to Admission medications   Medication Sig Start Date End Date Taking? Authorizing Provider  Albuterol Sulfate (PROAIR RESPICLICK) 108 (90 Base) MCG/ACT AEPB Inhale 2 puffs into the lungs every 6 (six) hours as needed. 09/03/15  Yes Elvina Sidle, MD  docusate-casanthranol (PERICOLACE) 100-30 MG per capsule Take 3 capsules by mouth daily.    Yes Historical Provider, MD  glucose blood (ONE TOUCH ULTRA TEST) test strip Test blood sugar once daily before breakfast. Dx code: R73.02 10/24/14  Yes Maurice March, MD  Lancets [facility] ULTRASOFT) lancets Use as instructed 01/26/14  Yes Chelle Jeffery, PA-C  lisinopril (PRINIVIL,ZESTRIL) 40 MG tablet Take 0.5 tablets (20 mg total) by mouth 2 (two) times daily. 03/15/15  Yes Sherren Mocha, MD  methocarbamol (ROBAXIN) 500 MG tablet Take 1-2 tablets (500-1,000 mg total) by mouth every 6 (six) hours as needed for muscle spasms. 05/28/15  Yes Ethelda Chick, MD  metolazone (ZAROXOLYN) 5 MG tablet Take 5 mg by mouth daily.   Yes Historical Provider, MD  Multiple Vitamins-Minerals (MULTIVITAMIN  WITH MINERALS) tablet Take 1 tablet by mouth daily.   Yes Historical Provider, MD  Polyethyl Glycol-Propyl Glycol (SYSTANE OP) Apply to eye 2 (two) times daily.   Yes Historical Provider, MD  potassium chloride (K-DUR,KLOR-CON) 10 MEQ tablet TAKE 1 TABLET BY MOUTH DAILY 06/07/15  Yes Sherren Mocha, MD       Porfirio Oar, PA-C    Allergies  Allergen Reactions  . Penicillins Anaphylaxis  . Bystolic [Nebivolol Hcl] Other (See Comments)    Headache   . Codeine     Causes ileus    Past Surgical History:  Procedure Laterality Date  . ACHILLES TENDON SURGERY  07/21/2012   Procedure: ACHILLES TENDON REPAIR;   Surgeon: Harvie Junior, MD;  Location: [facility];  Service: Orthopedics;  Laterality: Left;   haglun and gastroc resection  . COLONOSCOPY    . CYSTOCELE REPAIR    . DILATION AND CURETTAGE OF UTERUS  62  . EYE SURGERY    . TUBAL LIGATION      Social History  Substance Use Topics  . Smoking status: Former Smoker    Quit date: 06/30/1984  . Smokeless tobacco: Not on file  . Alcohol use Yes     Comment: Occasional wine drinker    Family History  Problem Relation Age of Onset  . Diabetes Mother   . Hypertension Mother   . Diabetes Sister   . Hypertension Sister   . Thyroid disease Sister   . Thyroid disease Brother   . Asthma Son   . Asthma Grandchild   . Asthma Grandchild   . Asthma Grandchild     Medication list has been reviewed and updated.  Physical Examination:  Physical Exam  Constitutional: She is oriented to person, place, and time. She appears well-developed and well-nourished. No distress.  HENT:  Head: Normocephalic and atraumatic.  Right Ear: Hearing, tympanic membrane, external ear and ear canal normal.  Left Ear: Hearing, tympanic membrane, external ear and ear canal normal.  Nose: Nose normal.  Mouth/Throat: Uvula is midline, oropharynx is clear and moist and mucous membranes are normal.  Eyes: Conjunctivae and lids are normal. Right eye exhibits no discharge. Left eye exhibits no discharge. No scleral icterus.  Neck: No JVD present.  Cardiovascular: Normal rate, regular rhythm, normal heart sounds and normal pulses.   No murmur heard. Pulmonary/Chest: Effort normal. No respiratory distress. She has wheezes (few, throughout). She has no rhonchi. She has no rales.  Musculoskeletal: Normal range of motion.  Lymphadenopathy:       Head (right side): No submental, no submandibular and no tonsillar adenopathy present.       Head (left side): No submental, no submandibular and no tonsillar adenopathy present.    She has no cervical adenopathy.   Neurological: She is alert and oriented to person, place, and time.  Skin: Skin is warm, dry and intact. No lesion and no rash noted.  No LE edema  Psychiatric: She has a normal mood and affect. Her speech is normal and behavior is normal. Thought content normal.   BP (!) 150/100 (BP Location: Left Arm, Cuff Size: Large)   Pulse 97   Temp 98.8 F (37.1 C)   Resp 18   Ht  (1.626 m)   Wt 242 lb (109.8 kg)   SpO2 97%   BMI 41.54 kg/m   EKG interpreted with Dr. Neva Seat: NSR without ischemic changes  Dg Chest 2 View  Result Date: 01/31/2016 CLINICAL DATA:  Wheezing and shortness of breath and chest tightness with lethargy for the past week ; history of asthma, morbid obesity, hyperlipidemia, remote history of smoking. EXAM: CHEST  2 VIEW COMPARISON:  PA and lateral chest x-ray of Nov 12, 2014 FINDINGS: The lungs are adequately inflated. The right hemidiaphragm remains higher than the left. The interstitial markings are coarse though stable. The heart is normal in size. The pulmonary vascularity is not engorged. There is calcification in the wall of the thoracic aorta. The mediastinum is normal in width. There is multilevel degenerative disc disease of the thoracic spine. IMPRESSION: Chronic bronchitic changes without hyperinflation. No alveolar pneumonia nor CHF. Aortic atherosclerosis. Electronically Signed   By: David  [place] M.D.   On: 01/31/2016 16:49   Results for orders placed or performed in visit on 01/31/16  Comprehensive metabolic panel  Result Value Ref Range   Sodium 140 135 - 146 mmol/L   Potassium 4.2 3.5 - 5.3 mmol/L   Chloride 101 98 - 110 mmol/L   CO2 21 20 - 31 mmol/L   Glucose, Bld 102 (H) 65 - 99 mg/dL   BUN 11 7 - 25 mg/dL   Creat 8.54 6.27 - 0.35 mg/dL   Total Bilirubin 0.3 0.2 - 1.2 mg/dL   Alkaline Phosphatase 67 33 - 130 U/L   AST 24 10 - 35 U/L   ALT 20 6 - 29 U/L   Total Protein 7.4 6.1 - 8.1 g/dL   Albumin 4.0 3.6 - 5.1 g/dL   Calcium 9.4 8.6 - 00.9  mg/dL  Brain natriuretic peptide  Result Value Ref Range   Brain Natriuretic Peptide  <100 pg/mL  POCT CBC  Result Value Ref Range   WBC 5.3 4.6 - 10.2 K/uL   Lymph, poc 2.3 0.6 - 3.4   POC LYMPH PERCENT 43.4 10 - 50 %L   MID (cbc) 0.4 0 - 0.9   POC MID % 8.0 0 - 12 %M   POC Granulocyte 2.6 2 - 6.9   Granulocyte percent 48.6 37 - 80 %G   RBC 4.58 4.04 - 5.48 M/uL   Hemoglobin 12.7 12.2 - 16.2 g/dL   HCT, POC 38.1 (A) 82.9 - 47.9 %   MCV 79.1 (A) 80 - 97 fL   MCH, POC 27.7 27 - 31.2 pg   MCHC 35.0 31.8 - 35.4 g/dL   RDW, POC 93.7 %   Platelet Count, POC 287 142 - 424 K/uL   MPV 7.9 0 - 99.8 fL  POCT glycosylated hemoglobin (Hb A1C)  Result Value Ref Range   Hemoglobin A1C 7.3     Assessment and Plan:  1. Asthma, mild intermittent, uncomplicated 2. SOB EKG NSR without ischemic changes. CBC wnl. CXR with chronic bronchitic changes, no evidence CHF. Will treat symptoms like asthmatic exacerbation with 5 day course low dose prednisone and continued prn albuterol. Do not think she needs abx at this time and in addition pt does not wish to take abx. Return in 1 week if symptoms do not improve or at any time if symptoms worsen.  - predniSONE (DELTASONE) 20 MG tablet; Take 1 tablet (20 mg total) by mouth daily with breakfast.  Dispense: 5 tablet; Refill: 0 - EKG 12-Lead - DG Chest 2 View; Future - POCT CBC - Comprehensive metabolic panel - Brain natriuretic peptide  3. Type 2 diabetes mellitus without complication, without long-term current use of insulin (HCC) A1C decreased from 8 to 7.3 with lifestyle changes. Return in 6 months for f/u. -  POCT glycosylated hemoglobin (Hb A1C)  4. Essential hypertension Uncontrolled, however pt does not wish to take her meds consistently. She takes metolazone whenever she can tell her extremities are swollen. Refilled. - metolazone (ZAROXOLYN) 5 MG tablet; Take 1 tablet (5 mg total) by mouth daily.  Dispense: 30 tablet; Refill: 5   Eleazar Kimmey V.  Dyke Brackett, MHS [facility]  02/01/2016

## 2016-01-31 NOTE — Patient Instructions (Addendum)
Albuterol as needed. Prednisone 20 mg once a day in the mornings for 5 days. I will call you with your lab tests. Return in 1 week if symptoms not improving or at any time if symptoms worsen   IF you received an x-ray today, you will receive an invoice from [facility]. Please contact [facility] at [phone] with questions or concerns regarding your invoice.   IF you received labwork today, you will receive an invoice from [employer]. Please contact [facility] at [phone] with questions or concerns regarding your invoice.   Our billing staff will not be able to assist you with questions regarding bills from these companies.  You will be contacted with the lab results as soon as they are available. The fastest way to get your results is to activate your My Chart account. Instructions are located on the last page of this paperwork. If you have not heard from [place] regarding the results in 2 weeks, please contact this office.

## 2016-02-01 LAB — COMPREHENSIVE METABOLIC PANEL
ALBUMIN: 4 g/dL (ref 3.6–5.1)
ALT: 20 U/L (ref 6–29)
AST: 24 U/L (ref 10–35)
Alkaline Phosphatase: 67 U/L (ref 33–130)
BUN: 11 mg/dL (ref 7–25)
CALCIUM: 9.4 mg/dL (ref 8.6–10.4)
CHLORIDE: 101 mmol/L (ref 98–110)
CO2: 21 mmol/L (ref 20–31)
Creat: 0.82 mg/dL (ref 0.60–0.93)
GLUCOSE: 102 mg/dL — AB (ref 65–99)
Potassium: 4.2 mmol/L (ref 3.5–5.3)
Sodium: 140 mmol/L (ref 135–146)
Total Bilirubin: 0.3 mg/dL (ref 0.2–1.2)
Total Protein: 7.4 g/dL (ref 6.1–8.1)

## 2016-02-01 LAB — BRAIN NATRIURETIC PEPTIDE: Brain Natriuretic Peptide: 17.9 pg/mL (ref <100)

## 2016-02-25 ENCOUNTER — Ambulatory Visit (INDEPENDENT_AMBULATORY_CARE_PROVIDER_SITE_OTHER): Payer: Medicare Other | Admitting: Physician Assistant

## 2016-02-25 VITALS — BP 190/100 | HR 91 | Temp 98.8°F | Resp 18 | Ht 64.0 in | Wt 242.0 lb

## 2016-02-25 DIAGNOSIS — T50905A Adverse effect of unspecified drugs, medicaments and biological substances, initial encounter: Secondary | ICD-10-CM

## 2016-02-25 DIAGNOSIS — I10 Essential (primary) hypertension: Secondary | ICD-10-CM

## 2016-02-25 DIAGNOSIS — T887XXA Unspecified adverse effect of drug or medicament, initial encounter: Secondary | ICD-10-CM

## 2016-02-25 DIAGNOSIS — L658 Other specified nonscarring hair loss: Secondary | ICD-10-CM | POA: Diagnosis not present

## 2016-02-25 MED ORDER — SPIRONOLACTONE 50 MG PO TABS: 50.0000 mg | ORAL_TABLET | Freq: Every day | ORAL | 3 refills | Status: DC

## 2016-02-25 MED ORDER — BLOOD GLUCOSE TEST VI STRP: 1.0000 | ORAL_STRIP | Freq: Every day | 3 refills | Status: DC

## 2016-02-25 NOTE — Progress Notes (Signed)
Patient ID: Nicole Abbott, female    DOB: June 07, 1946, 70 y.o.   MRN: [id number]  PCP: Norberto Sorenson, MD  Subjective:   Chief Complaint  Patient presents with  . Medication Problem    Metolazone, causing hair to fall out     HPI Presents desiring change in her antihypertensive treatment.  She is frustrated at her wait today.  She is a former Engineer, civil (consulting) and is distrustful of many current recommendations/guidelines in medical management, including blood pressure goals. Her personal goals for blood pressure are 130-150/80-90. She disagrees with the data indicating that there is an increased risk of cardiovascular events with blood pressures in this range.  She has associated her hair loss with the thiazide diuretic. Nail are breaking. Has been reducing the dose on her own. Had the same problem with HCTZ. She was reportedly started on HTN treatment initially due to LE edema.  She is interested in aldactone.  Review of her chart reveals previous prescriptions for metoprolol tartrate, metoprolol succinate, furosemide, enalapril, lisinopril, hydralazine, carvedilol, labetalol, clonidine, nebivolol and  chlorthalidone  There is also a previous prescription for spironolactone in 03/2015  Has had some recent asthma exacerbations. No CP. No HA, dizziness, nausea, vomiting or diarrhea. Chronic constipation.   She is NOT interested in the flu vaccine today..    Review of Systems  Constitutional: Negative.   HENT: Negative for sore throat.   Eyes: Negative for visual disturbance.  Respiratory: Negative for cough, chest tightness, shortness of breath and wheezing.   Cardiovascular: Negative for chest pain and palpitations.  Gastrointestinal: Negative for abdominal pain, diarrhea, nausea and vomiting.  Genitourinary: Negative for dysuria, frequency, hematuria and urgency.  Musculoskeletal: Negative for arthralgias and myalgias.  Skin: Negative for rash.       Hair loss  Neurological:  Negative for dizziness, weakness and headaches.  Psychiatric/Behavioral: Negative for decreased concentration. The patient is not nervous/anxious.        Patient Active Problem List   Diagnosis Date Noted  . Depression, major, recurrent, moderate (HCC) 04/06/2014  . Impaired glucose tolerance in obese 10/14/2013  . Obesity, Class III, BMI 40-49.9 (morbid obesity) (HCC) 03/04/2012  . Chest pain 08/17/2011  . Hyperlipidemia 08/17/2011  . Asthma 08/17/2011  . Hypertension   . Arthritis      Prior to Admission medications   Medication Sig Start Date End Date Taking? Authorizing Provider  Albuterol Sulfate (PROAIR RESPICLICK) 108 (90 Base) MCG/ACT AEPB Inhale 2 puffs into the lungs every 6 (six) hours as needed. 09/03/15  Yes Elvina Sidle, MD  docusate-casanthranol (PERICOLACE) 100-30 MG per capsule Take 3 capsules by mouth daily.    Yes Historical Provider, MD  Glucose Blood (BLOOD GLUCOSE TEST STRIPS) STRP 1 strip by In Vitro route daily before breakfast. 01/26/14  Yes Michiel Sivley, PA-C  glucose blood (ONE TOUCH ULTRA TEST) test strip Test blood sugar once daily before breakfast. Dx code: R73.02 10/24/14  Yes Maurice March, MD  Lancets [facility] ULTRASOFT) lancets Use as instructed 01/26/14  Yes Titilayo Hagans, PA-C  lisinopril (PRINIVIL,ZESTRIL) 40 MG tablet Take 0.5 tablets (20 mg total) by mouth 2 (two) times daily. 03/15/15  Yes Sherren Mocha, MD  metolazone (ZAROXOLYN) 5 MG tablet Take 1 tablet (5 mg total) by mouth daily. 01/31/16  Yes Dorna Leitz, PA-C  Multiple Vitamins-Minerals (MULTIVITAMIN WITH MINERALS) tablet Take 1 tablet by mouth daily.   Yes Historical Provider, MD  Polyethyl Glycol-Propyl Glycol (SYSTANE OP) Apply to eye 2 (two) times daily.  Yes Historical Provider, MD  potassium chloride (K-DUR,KLOR-CON) 10 MEQ tablet TAKE 1 TABLET BY MOUTH DAILY 06/07/15  Yes Sherren MochaEva N Shaw, MD     Allergies  Allergen Reactions  . Penicillins Anaphylaxis  . Bystolic [Nebivolol  Hcl] Other (See Comments)    Headache   . Codeine     Causes ileus       Objective:  Physical Exam  Constitutional: She is oriented to person, place, and time. She appears well-developed and well-nourished. She is active and cooperative. No distress.  BP (!) 190/100 (BP Location: Right Arm, Patient Position: Sitting, Cuff Size: Large)   Pulse 91   Temp 98.8 F (37.1 C) (Oral)   Resp 18   Ht 5\' 4"  (1.626 m)   Wt 242 lb (109.8 kg)   SpO2 97%   BMI 41.54 kg/m   HENT:  Head: Normocephalic and atraumatic.  Right Ear: Hearing normal.  Left Ear: Hearing normal.  Eyes: Conjunctivae are normal. No scleral icterus.  Neck: Normal range of motion. Neck supple. No thyromegaly present.  Cardiovascular: Normal rate, regular rhythm and normal heart sounds.   Pulses:      Radial pulses are 2+ on the right side, and 2+ on the left side.  Pulmonary/Chest: Effort normal and breath sounds normal.  Lymphadenopathy:       Head (right side): No tonsillar, no preauricular, no posterior auricular and no occipital adenopathy present.       Head (left side): No tonsillar, no preauricular, no posterior auricular and no occipital adenopathy present.    She has no cervical adenopathy.       Right: No supraclavicular adenopathy present.       Left: No supraclavicular adenopathy present.  Neurological: She is alert and oriented to person, place, and time. No sensory deficit.  Skin: Skin is warm, dry and intact. No rash noted. No cyanosis or erythema. Nails show no clubbing.  Psychiatric: She has a normal mood and affect. Her speech is normal and behavior is normal.           Assessment & Plan:   1. Essential hypertension Completely uncontrolled, off medication, and self-management. Start spironolactone. STOP potassium supplementation. Monitor her BP. Recommend that if readings persist above 140/90 she let us know for dose adjustment. Return for CMET in 2-4 weeks. - spironolactone (ALDACTONE) 50 MG  tablet; Take 1 tablet (50 mg total) by mouth daily.  Dispense: 30 tablet; Refill: 3 - Comprehensive metabolic panel; Future  2. Drug-related hair loss Patient has associated this with the thiazide diuretic family. She expects improved hair growth on spironolactone. Recommend additional evaluation if hair loss continues beyond the next several months.    Return in about 3 months (around 05/27/2016) for re-evaluation of blood pressure.    Fernande Brashelle S. Cathlin Buchan, PA-C Physician Assistant-Certified [facility]

## 2016-02-25 NOTE — Patient Instructions (Signed)
     IF you received an x-ray today, you will receive an invoice from [facility]. Please contact [facility] at [phone] with questions or concerns regarding your invoice.   IF you received labwork today, you will receive an invoice from [facility]. Please contact [facility] at [phone] with questions or concerns regarding your invoice.   Our billing staff will not be able to assist you with questions regarding bills from these companies.  You will be contacted with the lab results as soon as they are available. The fastest way to get your results is to activate your My Chart account. Instructions are located on the last page of this paperwork. If you have not heard from us regarding the results in 2 weeks, please contact this office.      

## 2016-04-17 DIAGNOSIS — H903 Sensorineural hearing loss, bilateral: Secondary | ICD-10-CM | POA: Diagnosis not present

## 2016-04-17 DIAGNOSIS — J310 Chronic rhinitis: Secondary | ICD-10-CM | POA: Diagnosis not present

## 2016-04-17 DIAGNOSIS — R430 Anosmia: Secondary | ICD-10-CM | POA: Diagnosis not present

## 2016-05-07 DIAGNOSIS — M5136 Other intervertebral disc degeneration, lumbar region: Secondary | ICD-10-CM | POA: Diagnosis not present

## 2016-05-19 ENCOUNTER — Other Ambulatory Visit: Payer: Self-pay | Admitting: Family Medicine

## 2016-05-21 NOTE — Telephone Encounter (Signed)
Pt calling to get refill on Lisinopril please respond

## 2016-05-23 ENCOUNTER — Other Ambulatory Visit: Payer: Self-pay | Admitting: Physician Assistant

## 2016-05-24 NOTE — Telephone Encounter (Signed)
01/2016 last ov and labs  

## 2016-05-27 DIAGNOSIS — M5136 Other intervertebral disc degeneration, lumbar region: Secondary | ICD-10-CM | POA: Diagnosis not present

## 2016-06-19 ENCOUNTER — Ambulatory Visit (INDEPENDENT_AMBULATORY_CARE_PROVIDER_SITE_OTHER): Payer: Medicare Other | Admitting: Family Medicine

## 2016-06-19 VITALS — BP 164/80 | HR 90 | Temp 98.9°F | Resp 16 | Ht 64.0 in | Wt 242.8 lb

## 2016-06-19 DIAGNOSIS — E118 Type 2 diabetes mellitus with unspecified complications: Secondary | ICD-10-CM | POA: Diagnosis not present

## 2016-06-19 DIAGNOSIS — I10 Essential (primary) hypertension: Secondary | ICD-10-CM | POA: Diagnosis not present

## 2016-06-19 MED ORDER — LISINOPRIL 40 MG PO TABS: 20.0000 mg | ORAL_TABLET | Freq: Two times a day (BID) | ORAL | 0 refills | Status: DC

## 2016-06-19 MED ORDER — SPIRONOLACTONE 50 MG PO TABS: 50.0000 mg | ORAL_TABLET | Freq: Every day | ORAL | 3 refills | Status: DC

## 2016-06-19 NOTE — Progress Notes (Signed)
Patient ID: Nicole Abbott, female    DOB: [birth year]  Age: 70 y.o. MRN: [id number]  Chief Complaint  Patient presents with  . Medication Refill    Lisinopril, Aldactone     Subjective:   70 year old lady who is here for a recheck of her blood pressure. She only sporadically takes the spironolactone because she does not believe that she should take it all of the time. She says that she knows what's best for body, and isn't going to do what are anyone else tells her to. She does not want to take metformin ever, convinced that it is bad for you. She takes her lisinopril, one half pill twice daily rather than once daily. She does not get much exercise ever since her Achilles tendon ruptured. She really does not want to get down to a lean body weight. 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 She needs refills on her medications. She does not smoke. Does not have excessive headaches or dizziness or chest pains or palpitations.  Current allergies, medications, problem list, past/family and social histories reviewed.  Objective:  BP (!) 164/80 (BP Location: Right Arm, Patient Position: Sitting, Cuff Size: Small)   Pulse 90   Temp 98.9 F (37.2 C) (Oral)   Resp 16   Ht 5\' 4"  (1.626 m)   Wt 242 lb 12.8 oz (110.1 kg)   SpO2 95%   BMI 41.68 kg/m   Pleasant lady, obese, alert and oriented. TMs normal. Throat clear. Neck supple without nodes or thyromegaly. No carotid bruits. Chest clear to auscultation. Heart regular without murmur. No edema. She apparently does get swelling at times.  Assessment & Plan:   Assessment: 1. Essential hypertension   2. Type 2 diabetes mellitus with complication, without long-term current use of insulin (HCC)       Plan: See instructions. Will check a CMP on her to make sure the spironolactone has not caused any thing to be metabolically imbalanced.  Orders Placed This Encounter   Procedures  . Comprehensive metabolic panel    Meds ordered this encounter  Medications  . spironolactone (ALDACTONE) 50 MG tablet    Sig: Take 1 tablet (50 mg total) by mouth daily.    Dispense:  30 tablet    Refill:  3  . lisinopril (PRINIVIL,ZESTRIL) 40 MG tablet    Sig: Take 0.5 tablets (20 mg total) by mouth 2 (two) times daily.    Dispense:  90 tablet    Refill:  0    **Patient requests 90 days supply**         Patient Instructions  I recommend taking the spironolactone daily for the blood pressure, which would ideally be below 140/90 and desirably 12/70.  Continue lisinopril 1/2 twice daily  Gradually increase exercise  Return in 4-6 months for recheck  Recommend watching diet to try to get the glucose and hemoglobin A1C (last was 7.3) to good control.  Desire A1C of about 6.5.      Return in about 6 months (around 12/18/2016).   HOPPER,DAVID, MD 06/19/2016

## 2016-06-19 NOTE — Patient Instructions (Addendum)
I recommend taking the spironolactone daily for the blood pressure, which would ideally be below 140/90 and desirably 12/70.  Continue lisinopril 1/2 twice daily  Gradually increase exercise  Return in 4-6 months for recheck  Recommend watching diet to try to get the glucose and hemoglobin A1C (last was 7.3) to good control.  Desire A1C of about 6.5.

## 2016-06-20 ENCOUNTER — Encounter: Payer: Self-pay | Admitting: Physician Assistant

## 2016-06-20 ENCOUNTER — Encounter: Payer: Self-pay | Admitting: Family Medicine

## 2016-06-20 DIAGNOSIS — M5136 Other intervertebral disc degeneration, lumbar region: Secondary | ICD-10-CM | POA: Insufficient documentation

## 2016-06-20 LAB — COMPREHENSIVE METABOLIC PANEL
ALT: 26 IU/L (ref 0–32)
AST: 26 IU/L (ref 0–40)
Albumin/Globulin Ratio: 1.1 — ABNORMAL LOW (ref 1.2–2.2)
Albumin: 4.1 g/dL (ref 3.5–4.8)
Alkaline Phosphatase: 75 IU/L (ref 39–117)
BUN/Creatinine Ratio: 11 — ABNORMAL LOW (ref 12–28)
BUN: 11 mg/dL (ref 8–27)
Bilirubin Total: <0.2 mg/dL (ref 0.0–1.2)
CALCIUM: 10 mg/dL (ref 8.7–10.3)
CO2: 25 mmol/L (ref 18–29)
Chloride: 99 mmol/L (ref 96–106)
Creatinine, Ser: 0.97 mg/dL (ref 0.57–1.00)
GFR, EST AFRICAN AMERICAN: 68 mL/min/{1.73_m2} (ref >59)
GFR, EST NON AFRICAN AMERICAN: 59 mL/min/{1.73_m2} — AB (ref >59)
GLUCOSE: 159 mg/dL — AB (ref 65–99)
Globulin, Total: 3.6 g/dL (ref 1.5–4.5)
POTASSIUM: 4.6 mmol/L (ref 3.5–5.2)
Sodium: 140 mmol/L (ref 134–144)
TOTAL PROTEIN: 7.7 g/dL (ref 6.0–8.5)

## 2016-07-20 ENCOUNTER — Ambulatory Visit (HOSPITAL_COMMUNITY)
Admission: EM | Admit: 2016-07-20 | Discharge: 2016-07-20 | Disposition: A | Discharge status: Home / Self Care | Payer: Medicare Other | Attending: Emergency Medicine | Admitting: Emergency Medicine

## 2016-07-20 ENCOUNTER — Encounter (HOSPITAL_COMMUNITY): Payer: Self-pay | Admitting: Emergency Medicine

## 2016-07-20 DIAGNOSIS — R0982 Postnasal drip: Secondary | ICD-10-CM | POA: Diagnosis not present

## 2016-07-20 DIAGNOSIS — J00 Acute nasopharyngitis [common cold]: Secondary | ICD-10-CM | POA: Diagnosis not present

## 2016-07-20 DIAGNOSIS — J9801 Acute bronchospasm: Secondary | ICD-10-CM | POA: Diagnosis not present

## 2016-07-20 MED ORDER — TRIAMCINOLONE ACETONIDE 40 MG/ML IJ SUSP
INTRAMUSCULAR | Status: AC
  Filled 2016-07-20: qty 1

## 2016-07-20 MED ORDER — ALBUTEROL SULFATE (2.5 MG/3ML) 0.083% IN NEBU
INHALATION_SOLUTION | RESPIRATORY_TRACT | Status: AC
  Filled 2016-07-20: qty 3

## 2016-07-20 MED ORDER — ALBUTEROL SULFATE (2.5 MG/3ML) 0.083% IN NEBU
5.0000 mg | INHALATION_SOLUTION | Freq: Once | RESPIRATORY_TRACT | Status: AC
  Administered 2016-07-20: 5 mg via RESPIRATORY_TRACT

## 2016-07-20 MED ORDER — IPRATROPIUM-ALBUTEROL 0.5-2.5 (3) MG/3ML IN SOLN
RESPIRATORY_TRACT | Status: AC
  Filled 2016-07-20: qty 3

## 2016-07-20 MED ORDER — IPRATROPIUM-ALBUTEROL 0.5-2.5 (3) MG/3ML IN SOLN
3.0000 mL | Freq: Once | RESPIRATORY_TRACT | Status: AC
  Administered 2016-07-20: 3 mL via RESPIRATORY_TRACT

## 2016-07-20 MED ORDER — TRIAMCINOLONE ACETONIDE 40 MG/ML IJ SUSP
40.0000 mg | Freq: Once | INTRAMUSCULAR | Status: AC
  Administered 2016-07-20: 40 mg via INTRAMUSCULAR

## 2016-07-20 MED ORDER — PREDNISONE 50 MG PO TABS: ORAL_TABLET | ORAL | 0 refills | Status: DC

## 2016-07-20 NOTE — ED Triage Notes (Signed)
The patient complained about sore throat and wheezing x2 weeks.

## 2016-07-20 NOTE — Discharge Instructions (Signed)
It is likely you developed an upper respiratory infection which stimulated your airways to develop bronchospasm. Recommend taking an antihistamine such as Allegra or Claritin. You may need to take this on a daily basis. Increase the use of your albuterol powder 2 puffs every 4 hours as needed. You may need to use this every 4 hours for the next one or 2 days to keep up with your bronchospasm. Start taking the prednisone tomorrow since she had an injection today. He should drink plenty of fluids and stay well-hydrated. May want to consider adding Pedialyte to increase her strength little quicker. If you get worse, have more trouble breathing or wheezing despite taking your medicine or if he develops fevers and more cough and shortness of breath this may indicate an infection such as pneumonia and may need to return or if much worse go to the emergency department.

## 2016-07-20 NOTE — ED Provider Notes (Signed)
CSN: [id number]     Arrival date & time 07/20/16  1209 History   First MD Initiated Contact with Patient 07/20/16 1349     Chief Complaint  Patient presents with  . Sore Throat   (Consider location/radiation/quality/duration/timing/severity/associated sxs/prior Treatment) 71 year old female states that she used to be a nurses complaining of a 2 day history of thick sputum that is draining down the right side of her throat causing her throat pain on that side she makes the emphasis that it is green. She is also having some right chest and back discomfort associated with shortness of breath. She states she has recurrent bronchitis. She denies fevers. She is currently afebrile. She had a history  of asthma as a child but not as an adult. She has a bottle of albuterol HFA that has been using it only once or twice a day.   She also states that on the 11th she had flu symptoms but those are much better.       Past Medical History:  Diagnosis Date  . Arthritis   . Asthma    as child  . Chronic constipation   . GERD (gastroesophageal reflux disease)   . Hypertension   . Wears glasses    Past Surgical History:  Procedure Laterality Date  . ACHILLES TENDON SURGERY  07/21/2012   Procedure: ACHILLES TENDON REPAIR;  Surgeon: Harvie Junior, MD;  Location: [facility];  Service: Orthopedics;  Laterality: Left;   haglun and gastroc resection  . COLONOSCOPY    . CYSTOCELE REPAIR    . DILATION AND CURETTAGE OF UTERUS  62  . EYE SURGERY    . TUBAL LIGATION     Family History  Problem Relation Age of Onset  . Diabetes Mother   . Hypertension Mother   . Diabetes Sister   . Hypertension Sister   . Thyroid disease Sister   . Thyroid disease Brother   . Asthma Son   . Asthma Grandchild   . Asthma Grandchild   . Asthma Grandchild    Social History  Substance Use Topics  . Smoking status: Former Smoker    Quit date: 06/30/1984  . Smokeless tobacco: Never Used  . Alcohol use Yes      Comment: Occasional wine drinker   OB History    No data available     Review of Systems  Constitutional: Negative.   HENT: Positive for postnasal drip and sore throat.   Eyes: Negative.   Respiratory: Positive for cough and shortness of breath.   Gastrointestinal: Negative.   Skin: Negative.   Neurological: Negative.   Psychiatric/Behavioral: Negative.   All other systems reviewed and are negative.   Allergies  Penicillins; Bystolic [nebivolol hcl]; Codeine; and Thiazide-type diuretics  Home Medications   Prior to Admission medications   Medication Sig Start Date End Date Taking? Authorizing Provider  Albuterol Sulfate (PROAIR RESPICLICK) 108 (90 Base) MCG/ACT AEPB Inhale 2 puffs into the lungs every 6 (six) hours as needed. 09/03/15   Elvina Sidle, MD  docusate-casanthranol (PERICOLACE) 100-30 MG per capsule Take 3 capsules by mouth daily.     Historical Provider, MD  Glucose Blood (BLOOD GLUCOSE TEST STRIPS) STRP 1 strip by In Vitro route daily before breakfast. 02/25/16   Chelle Jeffery, PA-C  glucose blood (ONE TOUCH ULTRA TEST) test strip Test blood sugar once daily before breakfast. Dx code: R73.02 10/24/14   Maurice March, MD  Lancets [facility] ULTRASOFT) lancets Use as instructed 01/26/14  Chelle Jeffery, PA-C  lisinopril (PRINIVIL,ZESTRIL) 40 MG tablet Take 0.5 tablets (20 mg total) by mouth 2 (two) times daily. 06/19/16   Peyton Najjar, MD  Multiple Vitamins-Minerals (MULTIVITAMIN WITH MINERALS) tablet Take 1 tablet by mouth daily.    Historical Provider, MD  Polyethyl Glycol-Propyl Glycol (SYSTANE OP) Apply to eye 2 (two) times daily.    Historical Provider, MD  predniSONE (DELTASONE) 50 MG tablet 1 tab po daily for 6 days. Take with food. 07/20/16   Hayden Rasmussen, NP  spironolactone (ALDACTONE) 50 MG tablet Take 1 tablet (50 mg total) by mouth daily. 06/19/16   Peyton Najjar, MD   Meds Ordered and Administered this Visit   Medications  ipratropium-albuterol  (DUONEB) 0.5-2.5 (3) MG/3ML nebulizer solution 3 mL (3 mLs Nebulization Given 07/20/16 1425)  triamcinolone acetonide (KENALOG-40) injection 40 mg (40 mg Intramuscular Given 07/20/16 1421)  albuterol (PROVENTIL) (2.5 MG/3ML) 0.083% nebulizer solution 5 mg (5 mg Nebulization Given 07/20/16 1508)    BP 174/94 (BP Location: Left Arm)   Pulse 84   Temp 98.2 F (36.8 C) (Oral)   Resp 16   SpO2 96%  No data found.   Physical Exam  Constitutional: She is oriented to person, place, and time. She appears well-developed and well-nourished. No distress.  HENT:  Head: Normocephalic and atraumatic.  Mouth/Throat: No oropharyngeal exudate.  Bilateral TMs are normal. Oropharynx appears clear and moist. No erythema or exudate.  Neck: Normal range of motion. Neck supple.  Cardiovascular: Normal rate, regular rhythm, normal heart sounds and intact distal pulses.   Pulmonary/Chest: No respiratory distress. She has wheezes.  Bilateral lungs with inspiratory and expiratory wheezes, prolonged expiratory phase and bilateral coarseness is wheeze throughout the entire expiratory phase.  Musculoskeletal: Normal range of motion.  Lymphadenopathy:    She has no cervical adenopathy.  Neurological: She is alert and oriented to person, place, and time.  Skin: Skin is warm and dry.  Psychiatric: She has a normal mood and affect.  Nursing note and vitals reviewed.   Urgent Care Course     Procedures (including critical care time)  Labs Review Labs Reviewed - No data to display  Imaging Review No results found.   Visual Acuity Review  Right Eye Distance:   Left Eye Distance:   Bilateral Distance:    Right Eye Near:   Left Eye Near:    Bilateral Near:         MDM   1. Acute nasopharyngitis   2. Bronchospasm   3. PND (post-nasal drip)    Post DuoNeb at 1455 hrs. the patient states she has some modest to moderate improvement in her ability to breathe and cough up some sputum.S He continues  to have expiratory wheezes diffusely. We will repeat albuterol neb at approximately 1510 hrs. After the second albuterol neb there continues to be modest to mild improvement in air movement and decrease in wheeze. There is still bilateral wheezing. The patient was asked if she continues to feel better and is ready to go home as opposed to going to the emergency Department for additional nebs and she currently does not want to go to the emergency department. She has improved, air movement has improved, she is resting more comfortably and showing no signs of shortness of breath. She will follow the below instructions and return if necessary. It is likely you developed an upper respiratory infection which stimulated your airways to develop bronchospasm. Recommend taking an antihistamine such as Allegra or Claritin.  You may need to take this on a daily basis. Increase the use of your albuterol powder 2 puffs every 4 hours as needed. You may need to use this every 4 hours for the next one or 2 days to keep up with your bronchospasm. Start taking the prednisone tomorrow since she had an injection today. He should drink plenty of fluids and stay well-hydrated. May want to consider adding Pedialyte to increase her strength little quicker. If you get worse, have more trouble breathing or wheezing despite taking your medicine or if he develops fevers and more cough and shortness of breath this may indicate an infection such as pneumonia and may need to return or if much worse go to the emergency department.    Hayden Rasmussenavid Delma Drone, NP 07/20/16 1549

## 2016-07-26 ENCOUNTER — Emergency Department (HOSPITAL_COMMUNITY): Payer: Medicare Other

## 2016-07-26 ENCOUNTER — Encounter (HOSPITAL_COMMUNITY): Payer: Self-pay

## 2016-07-26 ENCOUNTER — Emergency Department (HOSPITAL_COMMUNITY)
Admission: EM | Admit: 2016-07-26 | Discharge: 2016-07-26 | Disposition: A | Discharge status: Home / Self Care | Payer: Medicare Other | Attending: Emergency Medicine | Admitting: Emergency Medicine

## 2016-07-26 DIAGNOSIS — R0600 Dyspnea, unspecified: Secondary | ICD-10-CM | POA: Diagnosis not present

## 2016-07-26 DIAGNOSIS — I160 Hypertensive urgency: Secondary | ICD-10-CM | POA: Diagnosis not present

## 2016-07-26 DIAGNOSIS — R609 Edema, unspecified: Secondary | ICD-10-CM | POA: Diagnosis not present

## 2016-07-26 DIAGNOSIS — I10 Essential (primary) hypertension: Secondary | ICD-10-CM | POA: Insufficient documentation

## 2016-07-26 DIAGNOSIS — Z79899 Other long term (current) drug therapy: Secondary | ICD-10-CM | POA: Insufficient documentation

## 2016-07-26 DIAGNOSIS — J40 Bronchitis, not specified as acute or chronic: Secondary | ICD-10-CM | POA: Diagnosis not present

## 2016-07-26 DIAGNOSIS — J9801 Acute bronchospasm: Secondary | ICD-10-CM | POA: Insufficient documentation

## 2016-07-26 DIAGNOSIS — R05 Cough: Secondary | ICD-10-CM | POA: Diagnosis not present

## 2016-07-26 DIAGNOSIS — Z87891 Personal history of nicotine dependence: Secondary | ICD-10-CM | POA: Insufficient documentation

## 2016-07-26 DIAGNOSIS — R0602 Shortness of breath: Secondary | ICD-10-CM | POA: Diagnosis not present

## 2016-07-26 HISTORY — DX: Fibromyalgia: M79.7

## 2016-07-26 MED ORDER — DOXYCYCLINE HYCLATE 100 MG PO CAPS: 100.0000 mg | ORAL_CAPSULE | Freq: Two times a day (BID) | ORAL | 0 refills | Status: DC

## 2016-07-26 NOTE — ED Triage Notes (Addendum)
Pt c/o cough, chest congestion, and chest tightness x "a couple weeks."  Pt has been seen at Urgent Care x 2 for same.  Sts she has been taking OTC and prescription medication w/o relief.

## 2016-07-26 NOTE — ED Notes (Signed)
MD at bedside. 

## 2016-07-26 NOTE — ED Provider Notes (Signed)
[facility]-EMERGENCY DEPT Provider Note   CSN: [id number] Arrival date & time: 07/26/16  1258     History   Chief Complaint Chief Complaint  Patient presents with  . Cough  . Chest Congestion    HPI Nicole Abbott is a 71 y.o. female.  HPI Patient presents to the emergency department with ongoing productive cough and chest congestion as well as intermittent chest tightness associated with a cough.  She has not been on antibiotics.  [name] tried albuterol inhalers with some improvement in her symptoms.  She reports chills without documented fever.  No recent sick contacts.  Denies nausea vomiting diarrhea.  Denies myalgias.  Patient has a history of hypertension and intermittent compliance with her medications.  She is found to be hypertensive today.  She denies headache.  She denies weakness of her arms or legs.  No back pain.   Past Medical History:  Diagnosis Date  . Arthritis   . Asthma    as child  . Chronic constipation   . Fibromyalgia   . GERD (gastroesophageal reflux disease)   . Hypertension   . Wears glasses     Patient Active Problem List   Diagnosis Date Noted  . DDD (degenerative disc disease), lumbar 06/20/2016  . Depression, major, recurrent, moderate (HCC) 04/06/2014  . Impaired glucose tolerance in obese 10/14/2013  . Obesity, Class III, BMI 40-49.9 (morbid obesity) (HCC) 03/04/2012  . Chest pain 08/17/2011  . Hyperlipidemia 08/17/2011  . Asthma 08/17/2011  . Hypertension   . Arthritis     Past Surgical History:  Procedure Laterality Date  . ACHILLES TENDON SURGERY  07/21/2012   Procedure: ACHILLES TENDON REPAIR;  Surgeon: Harvie Junior, MD;  Location: [facility];  Service: Orthopedics;  Laterality: Left;   haglun and gastroc resection  . COLONOSCOPY    . CYSTOCELE REPAIR    . DILATION AND CURETTAGE OF UTERUS  62  . EYE SURGERY    . TUBAL LIGATION      OB History    No data available       Home Medications    Prior to  Admission medications   Medication Sig Start Date End Date Taking? Authorizing Provider  Albuterol Sulfate (PROAIR RESPICLICK) 108 (90 Base) MCG/ACT AEPB Inhale 2 puffs into the lungs every 6 (six) hours as needed. 09/03/15   Elvina Sidle, MD  docusate-casanthranol (PERICOLACE) 100-30 MG per capsule Take 3 capsules by mouth daily.     Historical Provider, MD  doxycycline (VIBRAMYCIN) 100 MG capsule Take 1 capsule (100 mg total) by mouth 2 (two) times daily. 07/26/16   Azalia Bilis, MD  Glucose Blood (BLOOD GLUCOSE TEST STRIPS) STRP 1 strip by In Vitro route daily before breakfast. 02/25/16   Porfirio Oar, PA-C  glucose blood (ONE TOUCH ULTRA TEST) test strip Test blood sugar once daily before breakfast. Dx code: R73.02 10/24/14   Maurice March, MD  Lancets [facility] ULTRASOFT) lancets Use as instructed 01/26/14   Chelle Jeffery, PA-C  lisinopril (PRINIVIL,ZESTRIL) 40 MG tablet Take 0.5 tablets (20 mg total) by mouth 2 (two) times daily. 06/19/16   Peyton Najjar, MD  Multiple Vitamins-Minerals (MULTIVITAMIN WITH MINERALS) tablet Take 1 tablet by mouth daily.    Historical Provider, MD  Polyethyl Glycol-Propyl Glycol (SYSTANE OP) Apply to eye 2 (two) times daily.    Historical Provider, MD  predniSONE (DELTASONE) 50 MG tablet 1 tab po daily for 6 days. Take with food. 07/20/16   Hayden Rasmussen, NP  spironolactone (ALDACTONE) 50 MG tablet Take 1 tablet (50 mg total) by mouth daily. 06/19/16   Peyton Najjar, MD    Family History Family History  Problem Relation Age of Onset  . Diabetes Mother   . Hypertension Mother   . Diabetes Sister   . Hypertension Sister   . Thyroid disease Sister   . Thyroid disease Brother   . Asthma Son   . Asthma Grandchild   . Asthma Grandchild   . Asthma Grandchild     Social History Social History  Substance Use Topics  . Smoking status: Former Smoker    Quit date: 06/30/1984  . Smokeless tobacco: Never Used  . Alcohol use Yes     Comment: Occasional wine  drinker     Allergies   Penicillins; Bystolic [nebivolol hcl]; Codeine; and Thiazide-type diuretics   Review of Systems Review of Systems  All other systems reviewed and are negative.    Physical Exam Updated Vital Signs BP 193/80 (BP Location: Left Arm)   Pulse 78   Temp 98.1 F (36.7 C) (Oral)   Resp 21   SpO2 98%   Physical Exam  Constitutional: She is oriented to person, place, and time. She appears well-developed and well-nourished. No distress.  HENT:  Head: Normocephalic and atraumatic.  Eyes: EOM are normal.  Neck: Normal range of motion.  Cardiovascular: Normal rate, regular rhythm and normal heart sounds.   Pulmonary/Chest: Effort normal and breath sounds normal.  Abdominal: Soft. She exhibits no distension. There is no tenderness.  Musculoskeletal: Normal range of motion.  Neurological: She is alert and oriented to person, place, and time.  Skin: Skin is warm and dry.  Psychiatric: She has a normal mood and affect. Judgment normal.  Nursing note and vitals reviewed.    ED Treatments / Results  Labs (all labs ordered are listed, but only abnormal results are displayed) Labs Reviewed - No data to display  EKG  EKG Interpretation  Date/Time:  Saturday July 26 2016 13:22:13 EST Ventricular Rate:  69 PR Interval:    QRS Duration: 101 QT Interval:  377 QTC Calculation: 401 R Axis:   38 Text Interpretation:  Sinus rhythm Atrial premature complex No significant change was found Confirmed by Elis Rawlinson  MD, Caryn Bee ([id number]) on 07/26/2016 1:53:44 PM       Radiology Dg Chest 2 View  Result Date: 07/26/2016 CLINICAL DATA:  Shortness of breath for 3 weeks. EXAM: CHEST  2 VIEW COMPARISON:  01/31/2016 FINDINGS: Cardiomediastinal silhouette is normal. Mediastinal contours appear intact. Stable eventration of the right hemidiaphragm. There is no evidence of focal airspace consolidation, pleural effusion or pneumothorax. Mild chronic coarsening of the interstitial  markings. Osseous structures are without acute abnormality. 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 Soft tissues are grossly normal. IMPRESSION: Chronic coarsening of interstitial markings thought to represent chronic bronchitic changes. Otherwise no evidence of acute cardiopulmonary disease. Electronically Signed   By: Ted Mcalpine M.D.   On: 07/26/2016 13:45    Procedures Procedures (including critical care time)  Medications Ordered in ED Medications - No data to display   Initial Impression / Assessment and Plan / ED Course  I have reviewed the triage vital signs and the nursing notes.  Pertinent labs & imaging results that were available during my care of the patient were reviewed by me and considered in my medical decision making (see chart for details).     Blood pressure remains elevated however she has  had intermittent compliance with the recommended blood pressure medicines prescribed by her physicians.  There is multiple metastases in the chart demonstrating a somewhat disregarded the patient for her elevated blood pressures.  I recommended that she take the blood pressure medicine as prescribed by her doctors in the doses and frequency with which they recommend.  Chest x-ray without obvious pneumonia.  Patient be started on doxycycline for bronchitis and possibly early developing pneumonia.  Overall well-appearing.  Doubt ACS.  Doubt PE.  Doubt dissection.  Final Clinical Impressions(s) / ED Diagnoses   Final diagnoses:  Bronchitis  Bronchospasm  Hypertension, unspecified type    New Prescriptions New Prescriptions   DOXYCYCLINE (VIBRAMYCIN) 100 MG CAPSULE    Take 1 capsule (100 mg total) by mouth 2 (two) times daily.     Azalia BilisKevin Zelia Yzaguirre, MD 07/27/16 [phone]

## 2016-09-17 ENCOUNTER — Other Ambulatory Visit: Payer: Self-pay | Admitting: Family Medicine

## 2016-09-17 DIAGNOSIS — I10 Essential (primary) hypertension: Secondary | ICD-10-CM

## 2016-10-31 ENCOUNTER — Encounter: Payer: Self-pay | Admitting: Family Medicine

## 2016-10-31 ENCOUNTER — Ambulatory Visit (INDEPENDENT_AMBULATORY_CARE_PROVIDER_SITE_OTHER): Payer: Medicare Other | Admitting: Family Medicine

## 2016-10-31 VITALS — BP 192/98 | HR 88 | Temp 99.4°F | Resp 18

## 2016-10-31 DIAGNOSIS — I10 Essential (primary) hypertension: Secondary | ICD-10-CM | POA: Diagnosis not present

## 2016-10-31 DIAGNOSIS — E118 Type 2 diabetes mellitus with unspecified complications: Secondary | ICD-10-CM

## 2016-10-31 DIAGNOSIS — M5010 Cervical disc disorder with radiculopathy, unspecified cervical region: Secondary | ICD-10-CM | POA: Diagnosis not present

## 2016-10-31 NOTE — Progress Notes (Signed)
Chief Complaint  Patient presents with  . Establish Care    refusal of weight, height, and discussing hx of medical issues/concerns.    HPI   Pt is here to establish care with a new provider here today.    Uncontrolled Hypertension Hypertension: Patient here for follow-up of elevated blood pressure. She is not exercising and is not adherent to low salt diet.  Blood pressure is not well controlled at home. Cardiac denies chest pain, dyspnea, lower extremity edema and palpitations. Cardiovascular risk factors: advanced age (older than 37 for men, 74 for women), diabetes mellitus, hypertension and obesity (BMI >= 30 kg/m2). Use of agents associated with hypertension: none. History of target organ damage: none.   BP Readings from Last 3 Encounters:  10/31/16 (!) 192/98  07/26/16 193/80  07/20/16 174/94   Pt had an Echocardiogram in 2013  Diabetes Mellitus: Patient presents for follow up of diabetes. Symptoms: none. Symptoms have stabilized. Patient denies foot ulcerations, hyperglycemia, hypoglycemia , increase appetite, nausea, paresthesia of the feet, polydipsia, polyuria, visual disturbances and vomitting.  Evaluation to date has been included: hemoglobin A1C.  Home sugars: BGs consistently in an acceptable range. Treatment to date: no recent interventions.  Lab Results  Component Value Date   HGBA1C 7.3 01/31/2016   Chronic Pain from DDD of lumbar spine and cervicalgia Pt sees Dr. Ethelene Hal from Orthopedics for her pain She states that she goes in when she has pain flares She also sees Rheumatology    Past Medical History:  Diagnosis Date  . Arthritis   . Asthma    as child  . Chronic constipation   . Fibromyalgia   . GERD (gastroesophageal reflux disease)   . Hypertension   . Wears glasses     Current Outpatient Prescriptions  Medication Sig Dispense Refill  . Albuterol Sulfate (PROAIR RESPICLICK) 108 (90 Base) MCG/ACT AEPB Inhale 2 puffs into the lungs every 6 (six)  hours as needed. 1 each 3  . docusate-casanthranol (PERICOLACE) 100-30 MG per capsule Take 3 capsules by mouth daily.     . Glucose Blood (BLOOD GLUCOSE TEST STRIPS) STRP 1 strip by In Vitro route daily before breakfast. 100 each 3  . glucose blood (ONE TOUCH ULTRA TEST) test strip Test blood sugar once daily before breakfast. Dx code: R73.02 100 each 1  . Lancets (ONETOUCH ULTRASOFT) lancets Use as instructed 100 each 12  . lisinopril (PRINIVIL,ZESTRIL) 40 MG tablet TAKE ONE-HALF TABLET BY MOUTH TWICE DAILY 90 tablet 0  . Multiple Vitamins-Minerals (MULTIVITAMIN WITH MINERALS) tablet Take 1 tablet by mouth daily.    Bertram Gala Glycol-Propyl Glycol (SYSTANE OP) Apply to eye 2 (two) times daily.    Marland Kitchen spironolactone (ALDACTONE) 50 MG tablet Take 1 tablet (50 mg total) by mouth daily. 30 tablet 3   No current facility-administered medications for this visit.     Allergies:  Allergies  Allergen Reactions  . Penicillins Anaphylaxis    Has patient had a PCN reaction causing immediate rash, facial/tongue/throat swelling, SOB or lightheadedness with hypotension: yes Has patient had a PCN reaction causing severe rash involving mucus membranes or skin necrosis: no Has patient had a PCN reaction that required hospitalization: yes Has patient had a PCN reaction occurring within the last 10 years: No If all of the above answers are "NO", then may proceed with Cephalosporin use.   Marland Kitchen Bystolic [Nebivolol Hcl] Other (See Comments)    Headache   . Codeine     Causes ileus  . Thiazide-Type  Diuretics Other (See Comments)    Hair loss    Past Surgical History:  Procedure Laterality Date  . ACHILLES TENDON SURGERY  07/21/2012   Procedure: ACHILLES TENDON REPAIR;  Surgeon: Harvie JuniorJohn L Graves, MD;  Location: [facility];  Service: Orthopedics;  Laterality: Left;   haglun and gastroc resection  . COLONOSCOPY    . CYSTOCELE REPAIR    . DILATION AND CURETTAGE OF UTERUS  62  . EYE SURGERY    .  TUBAL LIGATION      Social History   Social History  . Marital status: Divorced    Spouse name: N/A  . Number of children: N/A  . Years of education: N/A   Occupational History  . Retired-Licensed Practical Nurse Retired    Retired   Social History Main Topics  . Smoking status: Former Smoker    Quit date: 06/30/1984  . Smokeless tobacco: Never Used  . Alcohol use Yes     Comment: Occasional wine drinker  . Drug use: No  . Sexual activity: Not Currently    Birth control/ protection: Abstinence, Surgical   Other Topics Concern  . None   Social History Narrative   Exercises very little.    Review of Systems  Constitutional: Negative for chills and fever.  Respiratory: Negative for cough, shortness of breath and wheezing.   Cardiovascular: Negative for chest pain and palpitations.  Gastrointestinal: Negative for abdominal pain, nausea and vomiting.  Musculoskeletal: Positive for back pain, myalgias and neck pain. Negative for falls.  Neurological: Negative for dizziness, tingling and headaches.  Psychiatric/Behavioral: Positive for depression. The patient is not nervous/anxious.     Objective: Vitals:   10/31/16 1119 10/31/16 1136 10/31/16 1137  BP: (!) 193/103 (!) 220/104 (!) 192/98  Pulse: 88    Resp: 18    Temp: 99.4 F (37.4 C)    TempSrc: Oral    SpO2: 95%      Physical Exam  Constitutional: She is oriented to person, place, and time. She appears well-developed and well-nourished.  HENT:  Head: Normocephalic and atraumatic.  Eyes: Conjunctivae and EOM are normal.  Cardiovascular: Normal rate, regular rhythm and normal heart sounds.   Pulmonary/Chest: Effort normal and breath sounds normal. No respiratory distress.  Neurological: She is alert and oriented to person, place, and time.    Assessment and Plan Nicholos JohnsKathleen was seen today for establish care.  Diagnoses and all orders for this visit:  Uncontrolled hypertension- continue current medications Pt  is very resistant to starting any  Other medications Discussed trying self-hypnosis/guided imagery  Type 2 diabetes mellitus with complication, without long-term current use of insulin (HCC)  - diet controlled  -  Does not take vaccinations   Cervical disc disorder with radiculopathy of cervical region -  Continue with Orthopedics  Return in July for labs and Medicare Wellness exams   Tehya Leath A Creta LevinStallings

## 2016-10-31 NOTE — Patient Instructions (Addendum)
Annual Wellness Visit in July 2018    IF you received an x-ray today, you will receive an invoice from [facility]. Please contact [facility] at [phone] with questions or concerns regarding your invoice.   IF you received labwork today, you will receive an invoice from [facility]. Please contact [facility] at [phone] with questions or concerns regarding your invoice.   Our billing staff will not be able to assist you with questions regarding bills from these companies.  You will be contacted with the lab results as soon as they are available. The fastest way to get your results is to activate your My Chart account. Instructions are located on the last page of this paperwork. If you have not heard from us regarding the results in 2 weeks, please contact this office.     DASH Eating Plan DASH stands for "Dietary Approaches to Stop Hypertension." The DASH eating plan is a healthy eating plan that has been shown to reduce high blood pressure (hypertension). It may also reduce your risk for type 2 diabetes, heart disease, and stroke. The DASH eating plan may also help with weight loss. What are tips for following this plan? General guidelines   Avoid eating more than 2,300 mg (milligrams) of salt (sodium) a day. If you have hypertension, you may need to reduce your sodium intake to 1,500 mg a day.  Limit alcohol intake to no more than 1 drink a day for nonpregnant women and 2 drinks a day for men. One drink equals 12 oz of beer, 5 oz of wine, or 1 oz of hard liquor.  Work with your health care provider to maintain a healthy body weight or to lose weight. Ask what an ideal weight is for you.  Get at least 30 minutes of exercise that causes your heart to beat faster (aerobic exercise) most days of the week. Activities may include walking, swimming, or biking.  Work with your health care provider or diet and nutrition specialist (dietitian) to adjust your eating plan to  your individual calorie needs. Reading food labels   Check food labels for the amount of sodium per serving. Choose foods with less than 5 percent of the Daily Value of sodium. Generally, foods with less than 300 mg of sodium per serving fit into this eating plan.  To find whole grains, look for the word "whole" as the first word in the ingredient list. Shopping   Buy products labeled as "low-sodium" or "no salt added."  Buy fresh foods. Avoid canned foods and premade or frozen meals. Cooking   Avoid adding salt when cooking. Use salt-free seasonings or herbs instead of table salt or sea salt. Check with your health care provider or pharmacist before using salt substitutes.  Do not fry foods. Cook foods using healthy methods such as baking, boiling, grilling, and broiling instead.  Cook with heart-healthy oils, such as olive, canola, soybean, or sunflower oil. Meal planning    Eat a balanced diet that includes:  5 or more servings of fruits and vegetables each day. At each meal, try to fill half of your plate with fruits and vegetables.  Up to 6-8 servings of whole grains each day.  Less than 6 oz of lean meat, poultry, or fish each day. A 3-oz serving of meat is about the same size as a deck of cards. One egg equals 1 oz.  2 servings of low-fat dairy each day.  A serving of nuts, seeds, or beans 5 times each week.  Heart-healthy fats. Healthy fats called Omega-3 fatty acids are found in foods such as flaxseeds and coldwater fish, like sardines, salmon, and mackerel.  Limit how much you eat of the following:  Canned or prepackaged foods.  Food that is high in trans fat, such as fried foods.  Food that is high in saturated fat, such as fatty meat.  Sweets, desserts, sugary drinks, and other foods with added sugar.  Full-fat dairy products.  Do not salt foods before eating.  Try to eat at least 2 vegetarian meals each week.  Eat more home-cooked food and less  restaurant, buffet, and fast food.  When eating at a restaurant, ask that your food be prepared with less salt or no salt, if possible. What foods are recommended? The items listed may not be a complete list. Talk with your dietitian about what dietary choices are best for you. Grains  Whole-grain or whole-wheat bread. Whole-grain or whole-wheat pasta. Brown rice. Orpah Cobb. Bulgur. Whole-grain and low-sodium cereals. Pita bread. Low-fat, low-sodium crackers. Whole-wheat flour tortillas. Vegetables  Fresh or frozen vegetables (raw, steamed, roasted, or grilled). Low-sodium or reduced-sodium tomato and vegetable juice. Low-sodium or reduced-sodium tomato sauce and tomato paste. Low-sodium or reduced-sodium canned vegetables. Fruits  All fresh, dried, or frozen fruit. Canned fruit in natural juice (without added sugar). Meat and other protein foods  Skinless chicken or [place]. Ground chicken or [place]. Pork with fat trimmed off. Fish and seafood. Egg whites. Dried beans, peas, or lentils. Unsalted nuts, nut butters, and seeds. Unsalted canned beans. Lean cuts of beef with fat trimmed off. Low-sodium, lean deli meat. Dairy  Low-fat (1%) or fat-free (skim) milk. Fat-free, low-fat, or reduced-fat cheeses. Nonfat, low-sodium ricotta or cottage cheese. Low-fat or nonfat yogurt. Low-fat, low-sodium cheese. Fats and oils  Soft margarine without trans fats. Vegetable oil. Low-fat, reduced-fat, or light mayonnaise and salad dressings (reduced-sodium). Canola, safflower, olive, soybean, and sunflower oils. Avocado. Seasoning and other foods  Herbs. Spices. Seasoning mixes without salt. Unsalted popcorn and pretzels. Fat-free sweets. What foods are not recommended? The items listed may not be a complete list. Talk with your dietitian about what dietary choices are best for you. Grains  Baked goods made with fat, such as croissants, muffins, or some breads. Dry pasta or rice meal packs. Vegetables   Creamed or fried vegetables. Vegetables in a cheese sauce. Regular canned vegetables (not low-sodium or reduced-sodium). Regular canned tomato sauce and paste (not low-sodium or reduced-sodium). Regular tomato and vegetable juice (not low-sodium or reduced-sodium). Rosita Fire. Olives. Fruits  Canned fruit in a light or heavy syrup. Fried fruit. Fruit in cream or butter sauce. Meat and other protein foods  Fatty cuts of meat. Ribs. Fried meat. Tomasa Blase. Sausage. Bologna and other processed lunch meats. Salami. Fatback. Hotdogs. Bratwurst. Salted nuts and seeds. Canned beans with added salt. Canned or smoked fish. Whole eggs or egg yolks. Chicken or [place] with skin. Dairy  Whole or 2% milk, cream, and half-and-half. Whole or full-fat cream cheese. Whole-fat or sweetened yogurt. Full-fat cheese. Nondairy creamers. Whipped toppings. Processed cheese and cheese spreads. Fats and oils  Butter. Stick margarine. Lard. Shortening. Ghee. Bacon fat. Tropical oils, such as coconut, palm kernel, or palm oil. Seasoning and other foods  Salted popcorn and pretzels. Onion salt, garlic salt, seasoned salt, table salt, and sea salt. Worcestershire sauce. Tartar sauce. Barbecue sauce. Teriyaki sauce. Soy sauce, including reduced-sodium. Steak sauce. Canned and packaged gravies. Fish sauce. Oyster sauce. Cocktail sauce. Horseradish that you find on the  shelf. Ketchup. Mustard. Meat flavorings and tenderizers. Bouillon cubes. Hot sauce and Tabasco sauce. Premade or packaged marinades. Premade or packaged taco seasonings. Relishes. Regular salad dressings. Where to find more information:  National Heart, Lung, and Blood Institute: PopSteam.is  American Heart Association: www.heart.org Summary  The DASH eating plan is a healthy eating plan that has been shown to reduce high blood pressure (hypertension). It may also reduce your risk for type 2 diabetes, heart disease, and stroke.  With the DASH eating plan, you  should limit salt (sodium) intake to 2,300 mg a day. If you have hypertension, you may need to reduce your sodium intake to 1,500 mg a day.  When on the DASH eating plan, aim to eat more fresh fruits and vegetables, whole grains, lean proteins, low-fat dairy, and heart-healthy fats.  Work with your health care provider or diet and nutrition specialist (dietitian) to adjust your eating plan to your individual calorie needs. This information is not intended to replace advice given to you by your health care provider. Make sure you discuss any questions you have with your health care provider. Document Released: 06/05/2011 Document Revised: 06/09/2016 Document Reviewed: 06/09/2016 Elsevier Interactive Patient Education  2017 [employer].

## 2016-11-13 DIAGNOSIS — M542 Cervicalgia: Secondary | ICD-10-CM | POA: Diagnosis not present

## 2016-11-13 DIAGNOSIS — M50323 Other cervical disc degeneration at C6-C7 level: Secondary | ICD-10-CM | POA: Diagnosis not present

## 2016-11-13 DIAGNOSIS — M5136 Other intervertebral disc degeneration, lumbar region: Secondary | ICD-10-CM | POA: Diagnosis not present

## 2016-11-18 DIAGNOSIS — M50323 Other cervical disc degeneration at C6-C7 level: Secondary | ICD-10-CM | POA: Diagnosis not present

## 2016-11-18 DIAGNOSIS — M5136 Other intervertebral disc degeneration, lumbar region: Secondary | ICD-10-CM | POA: Diagnosis not present

## 2016-12-04 DIAGNOSIS — M542 Cervicalgia: Secondary | ICD-10-CM | POA: Diagnosis not present

## 2016-12-04 DIAGNOSIS — M50323 Other cervical disc degeneration at C6-C7 level: Secondary | ICD-10-CM | POA: Diagnosis not present

## 2016-12-04 DIAGNOSIS — M5136 Other intervertebral disc degeneration, lumbar region: Secondary | ICD-10-CM | POA: Diagnosis not present

## 2016-12-08 ENCOUNTER — Other Ambulatory Visit: Payer: Self-pay | Admitting: Family Medicine

## 2016-12-08 DIAGNOSIS — I10 Essential (primary) hypertension: Secondary | ICD-10-CM

## 2017-01-07 ENCOUNTER — Ambulatory Visit (INDEPENDENT_AMBULATORY_CARE_PROVIDER_SITE_OTHER): Payer: Medicare Other | Admitting: Family Medicine

## 2017-01-07 ENCOUNTER — Encounter: Payer: Self-pay | Admitting: Family Medicine

## 2017-01-07 ENCOUNTER — Encounter: Payer: Medicare Other | Admitting: Family Medicine

## 2017-01-07 VITALS — BP 207/75 | HR 84 | Temp 98.3°F | Resp 16 | Ht 64.0 in | Wt 244.4 lb

## 2017-01-07 DIAGNOSIS — E118 Type 2 diabetes mellitus with unspecified complications: Secondary | ICD-10-CM

## 2017-01-07 DIAGNOSIS — Z0000 Encounter for general adult medical examination without abnormal findings: Secondary | ICD-10-CM

## 2017-01-07 DIAGNOSIS — M2550 Pain in unspecified joint: Secondary | ICD-10-CM

## 2017-01-07 DIAGNOSIS — R739 Hyperglycemia, unspecified: Secondary | ICD-10-CM

## 2017-01-07 DIAGNOSIS — G8929 Other chronic pain: Secondary | ICD-10-CM

## 2017-01-07 DIAGNOSIS — I10 Essential (primary) hypertension: Secondary | ICD-10-CM | POA: Diagnosis not present

## 2017-01-07 LAB — POCT GLYCOSYLATED HEMOGLOBIN (HGB A1C): HEMOGLOBIN A1C: 8.6

## 2017-01-07 MED ORDER — SPIRONOLACTONE 50 MG PO TABS: 50.0000 mg | ORAL_TABLET | Freq: Every day | ORAL | 0 refills | Status: DC

## 2017-01-07 MED ORDER — LISINOPRIL 40 MG PO TABS: 40.0000 mg | ORAL_TABLET | Freq: Every day | ORAL | 0 refills | Status: DC

## 2017-01-07 NOTE — Progress Notes (Signed)
Chief Complaint  Patient presents with  . Annual Exam    Subjective:  Nicole Abbott is a 71 y.o. female here for a health maintenance visit.  Patient is established pt  Diabetes Pt reports that 3 weeks ago she has a corticosteroid injection and states that her sugars have been in the 200s. Normally her sugars are 150s She denies polyuria, polydipsia, polyphagia  Chronic Joint Pain She has increasing pain in her joints and reports that she also has some shoulder pain in the bilateral shoulders. Her pain today is 9/10 she has corticosteroid injection in the lumbosacral and cervical spine  Wt Readings from Last 3 Encounters:  01/07/17 244 lb 6.4 oz (110.9 kg)  06/19/16 242 lb 12.8 oz (110.1 kg)  02/25/16 242 lb (109.8 kg)   Patient Active Problem List   Diagnosis Date Noted  . DDD (degenerative disc disease), lumbar 06/20/2016  . Depression, major, recurrent, moderate (HCC) 04/06/2014  . Impaired glucose tolerance in obese 10/14/2013  . Obesity, Class III, BMI 40-49.9 (morbid obesity) (HCC) 03/04/2012  . Chest pain 08/17/2011  . Hyperlipidemia 08/17/2011  . Asthma 08/17/2011  . Hypertension   . Arthritis     Past Medical History:  Diagnosis Date  . Arthritis   . Asthma    as child  . Chronic constipation   . Diabetes mellitus without complication (HCC)   . Fibromyalgia   . GERD (gastroesophageal reflux disease)   . Hypertension   . Wears glasses     Past Surgical History:  Procedure Laterality Date  . ACHILLES TENDON SURGERY  07/21/2012   Procedure: ACHILLES TENDON REPAIR;  Surgeon: Harvie Junior, MD;  Location: [facility];  Service: Orthopedics;  Laterality: Left;   haglun and gastroc resection  . COLONOSCOPY    . CYSTOCELE REPAIR    . DILATION AND CURETTAGE OF UTERUS  62  . EYE SURGERY    . TUBAL LIGATION       Outpatient Medications Prior to Visit  Medication Sig Dispense Refill  . Albuterol Sulfate (PROAIR RESPICLICK) 108 (90 Base)  MCG/ACT AEPB Inhale 2 puffs into the lungs every 6 (six) hours as needed. 1 each 3  . docusate-casanthranol (PERICOLACE) 100-30 MG per capsule Take 3 capsules by mouth daily.     . Glucose Blood (BLOOD GLUCOSE TEST STRIPS) STRP 1 strip by In Vitro route daily before breakfast. 100 each 3  . glucose blood (ONE TOUCH ULTRA TEST) test strip Test blood sugar once daily before breakfast. Dx code: R73.02 100 each 1  . Lancets (ONETOUCH ULTRASOFT) lancets Use as instructed 100 each 12  . Multiple Vitamins-Minerals (MULTIVITAMIN WITH MINERALS) tablet Take 1 tablet by mouth daily.    Bertram Gala Glycol-Propyl Glycol (SYSTANE OP) Apply to eye 2 (two) times daily.    Marland Kitchen lisinopril (PRINIVIL,ZESTRIL) 40 MG tablet TAKE 1/2 TABLET BY MOUTH TWICE DAILY 45 tablet 0  . spironolactone (ALDACTONE) 50 MG tablet Take 1 tablet (50 mg total) by mouth daily. 30 tablet 3   No facility-administered medications prior to visit.     Allergies  Allergen Reactions  . Penicillins Anaphylaxis    Has patient had a PCN reaction causing immediate rash, facial/tongue/throat swelling, SOB or lightheadedness with hypotension: yes Has patient had a PCN reaction causing severe rash involving mucus membranes or skin necrosis: no Has patient had a PCN reaction that required hospitalization: yes Has patient had a PCN reaction occurring within the last 10 years: No If all of the  above answers are "NO", then may proceed with Cephalosporin use.   Marland Kitchen. Bystolic [Nebivolol Hcl] Other (See Comments)    Headache   . Codeine     Causes ileus  . Thiazide-Type Diuretics Other (See Comments)    Hair loss     Family History  Problem Relation Age of Onset  . Diabetes Mother   . Hypertension Mother   . Diabetes Sister   . Hypertension Sister   . Thyroid disease Sister   . Thyroid disease Brother   . Asthma Son   . Asthma Grandchild   . Asthma Grandchild   . Asthma Grandchild      Health Habits: Dental Exam: not up to date Eye  Exam: up to date Exercise: 0 times/week on average Current exercise activities: uses cane Diet: diabetic diet  Social History   Social History  . Marital status: Divorced    Spouse name: N/A  . Number of children: N/A  . Years of education: N/A   Occupational History  . Retired-Licensed Practical Nurse Retired    Retired   Social History Main Topics  . Smoking status: Former Smoker    Quit date: 06/30/1984  . Smokeless tobacco: Never Used  . Alcohol use Yes     Comment: Occasional wine drinker  . Drug use: No  . Sexual activity: Not Currently    Birth control/ protection: Abstinence, Surgical   Other Topics Concern  . Not on file   Social History Narrative   Exercises very little.   History  Alcohol Use  . Yes    Comment: Occasional wine drinker   History  Smoking Status  . Former Smoker  . Quit date: 06/30/1984  Smokeless Tobacco  . Never Used   History  Drug Use No    GYN: Sexual Health Menstrual status: regular menses LMP: No LMP recorded. Patient is postmenopausal.   Health Maintenance: See under health Maintenance activity for review of completion dates as well.  There is no immunization history on file for this patient.    Depression Screen-PHQ2/9 Depression screen [facility] 01/07/2017 06/19/2016 02/25/2016 01/31/2016 06/04/2015  Decreased Interest 3 0 0 0 0  Down, Depressed, Hopeless 3 0 0 0 0  PHQ - 2 Score 6 0 0 0 0  Altered sleeping 1 - - - -  Tired, decreased energy 3 - - - -  Change in appetite 2 - - - -  Feeling bad or failure about yourself  0 - - - -  Trouble concentrating 3 - - - -  Moving slowly or fidgety/restless 3 - - - -  Suicidal thoughts 0 - - - -  PHQ-9 Score 18 - - - -  Difficult doing work/chores Not difficult at all - - - -    Depression Severity and Treatment Recommendations:  0-4= None  5-9= Mild / Treatment: Support, educate to call if worse; return in one month  10-14= Moderate / Treatment: Support, watchful waiting;  Antidepressant or Psycotherapy  15-19= Moderately severe / Treatment: Antidepressant OR Psychotherapy  >= 20 = Major depression, severe / Antidepressant AND Psychotherapy    Review of Systems   Review of Systems  Constitutional: Negative for chills and fever.  Eyes: Negative for blurred vision and double vision.  Gastrointestinal: Negative for abdominal pain, nausea and vomiting.  Musculoskeletal: Positive for back pain, joint pain, myalgias and neck pain.  Skin: Negative for itching and rash.    See HPI for ROS as well.  Objective:   Vitals:   01/07/17 1411 01/07/17 1440  BP: (!) 206/107 (!) 207/75  Pulse: 87 84  Resp: 16   Temp: 98.3 F (36.8 C)   TempSrc: Oral   SpO2: 95%   Weight: 244 lb 6.4 oz (110.9 kg)   Height: 5\' 4"  (1.626 m)    Body mass index is 41.95 kg/m.  Physical Exam  Constitutional: She is oriented to person, place, and time. She appears well-developed and well-nourished.  HENT:  Head: Normocephalic and atraumatic.  Eyes: Conjunctivae and EOM are normal.  Cardiovascular: Normal rate, regular rhythm and normal heart sounds.   No murmur heard. Pulmonary/Chest: Effort normal and breath sounds normal. No respiratory distress. She has no wheezes.  Musculoskeletal: Normal range of motion. She exhibits no edema.  Neurological: She is alert and oriented to person, place, and time. She has normal reflexes.  Psychiatric: She has a normal mood and affect. Her behavior is normal. Judgment and thought content normal.       Assessment/Plan:   Patient was seen for a health maintenance exam.  Counseled the patient on health maintenance issues. Reviewed her health mainteance schedule and ordered appropriate tests (see orders.) Counseled on regular exercise and weight management. Recommend regular eye exams and dental cleaning.   The following issues were addressed today for health maintenance:   Nicole Abbott was seen today for annual exam.  Diagnoses and all  orders for this visit:  Encounter for Medicare annual wellness exam  Uncontrolled hypertension- discussed addition of spironolactone -     Comprehensive metabolic panel -     Lipid panel  Type 2 diabetes mellitus with complication, without long-term current use of insulin (HCC)-  -     Comprehensive metabolic panel -     Lipid panel -     Cancel: Hemoglobin A1c -     POCT glycosylated hemoglobin (Hb A1C)  Chronic joint pain-  Advised pt to try Integrative and Complementary medicines  Essential hypertension- bp uncontrolled  Pt only takes spironolactone sporadically due to concerns about hypokalemia Discussed that her K has been normal and Lisinopril can cause hyperkalemia so the two together would lead to less K derangement  Hyperglycemia -     POCT glycosylated hemoglobin (Hb A1C)    Return in about 4 weeks (around 02/04/2017) for hyperglycemia.    Body mass index is 41.95 kg/m.:  Discussed the patient's BMI with patient. The BMI body mass index is 41.95 kg/m.     Future Appointments Date Time Provider Department Center  02/11/2017 11:20 AM Doristine Bosworth, MD PCP-PCP [facility]    Patient Instructions  [facility] : [facility]  Internist in [place], [facility]  [place], [address], [place], [place] [address]  Phone: [phone]    We recommend that you schedule a mammogram for breast cancer screening. Typically, you do not need a referral to do this. Please contact a local imaging center to schedule your mammogram.  [facility] - [phone]  *ask for the Radiology Department [facility]) - [phone] or [phone]  [facility] - [phone] [facility] - [phone] [facility] - [phone]  *ask for the Radiology Department [facility] - [phone]  *ask for the Radiology Department [facility] - [phone]  *ask for the Mammography Department [facility] - [phone]

## 2017-01-07 NOTE — Patient Instructions (Addendum)
[facility] : [facility]  Internist in [place], [facility] [place]  [place], [address], [place], [place] [address]  Phone: [phone]) [phone]    We recommend that you schedule a mammogram for breast cancer screening. Typically, you do not need a referral to do this. Please contact a local imaging center to schedule your mammogram.  [facility] - [phone]  *ask for the Radiology Department [facility] - [phone]) [phone] or [phone]) [phone]  [facility] - [phone] [facility] - [phone]) [phone] [facility] - [phone]  *ask for the Radiology Department [facility] - [phone]) [phone]  *ask for the Radiology Department [facility] - [phone]) [phone]  *ask for the Mammography Department [facility] - [phone]) [phone]

## 2017-01-08 LAB — COMPREHENSIVE METABOLIC PANEL
ALBUMIN: 4.3 g/dL (ref 3.5–4.8)
ALK PHOS: 79 IU/L (ref 39–117)
ALT: 28 IU/L (ref 0–32)
AST: 27 IU/L (ref 0–40)
Albumin/Globulin Ratio: 1.3 (ref 1.2–2.2)
BUN / CREAT RATIO: 13 (ref 12–28)
BUN: 11 mg/dL (ref 8–27)
Bilirubin Total: 0.3 mg/dL (ref 0.0–1.2)
CALCIUM: 10 mg/dL (ref 8.7–10.3)
CO2: 22 mmol/L (ref 20–29)
CREATININE: 0.83 mg/dL (ref 0.57–1.00)
Chloride: 101 mmol/L (ref 96–106)
GFR calc Af Amer: 81 mL/min/{1.73_m2} (ref >59)
GFR, EST NON AFRICAN AMERICAN: 71 mL/min/{1.73_m2} (ref >59)
GLOBULIN, TOTAL: 3.3 g/dL (ref 1.5–4.5)
Glucose: 132 mg/dL — ABNORMAL HIGH (ref 65–99)
POTASSIUM: 4.1 mmol/L (ref 3.5–5.2)
SODIUM: 142 mmol/L (ref 134–144)
Total Protein: 7.6 g/dL (ref 6.0–8.5)

## 2017-01-08 LAB — LIPID PANEL
CHOL/HDL RATIO: 3.2 ratio (ref 0.0–4.4)
Cholesterol, Total: 224 mg/dL — ABNORMAL HIGH (ref 100–199)
HDL: 69 mg/dL (ref >39)
LDL CALC: 142 mg/dL — AB (ref 0–99)
TRIGLYCERIDES: 63 mg/dL (ref 0–149)
VLDL Cholesterol Cal: 13 mg/dL (ref 5–40)

## 2017-01-23 DIAGNOSIS — E113291 Type 2 diabetes mellitus with mild nonproliferative diabetic retinopathy without macular edema, right eye: Secondary | ICD-10-CM | POA: Diagnosis not present

## 2017-01-23 DIAGNOSIS — H524 Presbyopia: Secondary | ICD-10-CM | POA: Diagnosis not present

## 2017-01-23 DIAGNOSIS — Z961 Presence of intraocular lens: Secondary | ICD-10-CM | POA: Diagnosis not present

## 2017-01-23 LAB — HM DIABETES EYE EXAM

## 2017-02-06 ENCOUNTER — Encounter: Payer: Self-pay | Admitting: Family Medicine

## 2017-02-06 ENCOUNTER — Ambulatory Visit (INDEPENDENT_AMBULATORY_CARE_PROVIDER_SITE_OTHER): Payer: Medicare Other | Admitting: Family Medicine

## 2017-02-06 VITALS — BP 173/82 | HR 74 | Temp 98.1°F | Resp 16 | Ht 64.0 in | Wt 244.6 lb

## 2017-02-06 DIAGNOSIS — I10 Essential (primary) hypertension: Secondary | ICD-10-CM | POA: Diagnosis not present

## 2017-02-06 NOTE — Patient Instructions (Signed)
     IF you received an x-ray today, you will receive an invoice from [facility]. Please contact [facility] at [phone] with questions or concerns regarding your invoice.   IF you received labwork today, you will receive an invoice from [facility]. Please contact [facility] at [phone] with questions or concerns regarding your invoice.   Our billing staff will not be able to assist you with questions regarding bills from these companies.  You will be contacted with the lab results as soon as they are available. The fastest way to get your results is to activate your My Chart account. Instructions are located on the last page of this paperwork. If you have not heard from us regarding the results in 2 weeks, please contact this office.     

## 2017-02-06 NOTE — Progress Notes (Signed)
Chief Complaint  Patient presents with  . Follow-up    3 month follow up  . Hypertension    HPI  Hypertension: Patient here for follow-up of elevated blood pressure. She is exercising and is adherent to low salt diet.  Blood pressure is not checked at home. She feels better overall and is understanding that she needs to take her pain meds. No chest pains, palpitations, shortness of breath.  BP Readings from Last 3 Encounters:  02/06/17 (!) 173/82  01/07/17 (!) 207/75  10/31/16 (!) 192/98     Past Medical History:  Diagnosis Date  . Arthritis   . Asthma    as child  . Chronic constipation   . Diabetes mellitus without complication (HCC)   . Fibromyalgia   . GERD (gastroesophageal reflux disease)   . Hypertension   . Wears glasses     Current Outpatient Prescriptions  Medication Sig Dispense Refill  . Albuterol Sulfate (PROAIR RESPICLICK) 108 (90 Base) MCG/ACT AEPB Inhale 2 puffs into the lungs every 6 (six) hours as needed. 1 each 3  . docusate-casanthranol (PERICOLACE) 100-30 MG per capsule Take 3 capsules by mouth daily.     . fexofenadine (ALLEGRA) 180 MG tablet Take 180 mg by mouth daily.    . Glucose Blood (BLOOD GLUCOSE TEST STRIPS) STRP 1 strip by In Vitro route daily before breakfast. 100 each 3  . glucose blood (ONE TOUCH ULTRA TEST) test strip Test blood sugar once daily before breakfast. Dx code: R73.02 100 each 1  . Lancets (ONETOUCH ULTRASOFT) lancets Use as instructed 100 each 12  . lisinopril (PRINIVIL,ZESTRIL) 40 MG tablet Take 1 tablet (40 mg total) by mouth daily. 90 tablet 0  . Multiple Vitamins-Minerals (MULTIVITAMIN WITH MINERALS) tablet Take 1 tablet by mouth daily.    Bertram Gala. Polyethyl Glycol-Propyl Glycol (SYSTANE OP) Apply to eye 2 (two) times daily.    Marland Kitchen. spironolactone (ALDACTONE) 50 MG tablet Take 1 tablet (50 mg total) by mouth daily. 90 tablet 0   No current facility-administered medications for this visit.     Allergies:  Allergies  Allergen  Reactions  . Penicillins Anaphylaxis    Has patient had a PCN reaction causing immediate rash, facial/tongue/throat swelling, SOB or lightheadedness with hypotension: yes Has patient had a PCN reaction causing severe rash involving mucus membranes or skin necrosis: no Has patient had a PCN reaction that required hospitalization: yes Has patient had a PCN reaction occurring within the last 10 years: No If all of the above answers are "NO", then may proceed with Cephalosporin use.   Marland Kitchen. Bystolic [Nebivolol Hcl] Other (See Comments)    Headache   . Codeine     Causes ileus  . Thiazide-Type Diuretics Other (See Comments)    Hair loss    Past Surgical History:  Procedure Laterality Date  . ACHILLES TENDON SURGERY  07/21/2012   Procedure: ACHILLES TENDON REPAIR;  Surgeon: Harvie JuniorJohn L Graves, MD;  Location: [facility];  Service: Orthopedics;  Laterality: Left;   haglun and gastroc resection  . COLONOSCOPY    . CYSTOCELE REPAIR    . DILATION AND CURETTAGE OF UTERUS  62  . EYE SURGERY    . TUBAL LIGATION      Social History   Social History  . Marital status: Divorced    Spouse name: N/A  . Number of children: N/A  . Years of education: N/A   Occupational History  . Retired-Licensed Practical Nurse Retired    Retired  Social History Main Topics  . Smoking status: Former Smoker    Quit date: 06/30/1984  . Smokeless tobacco: Never Used  . Alcohol use Yes     Comment: Occasional wine drinker  . Drug use: No  . Sexual activity: Not Currently    Birth control/ protection: Abstinence, Surgical   Other Topics Concern  . None   Social History Narrative   Exercises very little.    ROS See hpi  Objective: Vitals:   02/06/17 1150  BP: (!) 173/82  Pulse: 74  Resp: 16  Temp: 98.1 F (36.7 C)  TempSrc: Oral  SpO2: 97%  Weight: 244 lb 9.6 oz (110.9 kg)  Height: 5\' 4"  (1.626 m)    Physical Exam Physical Exam  Constitutional: She is oriented to person, place,  and time. She appears well-developed and well-nourished.  HENT:  Head: Normocephalic and atraumatic.  Eyes: Conjunctivae and EOM are normal.  Cardiovascular: Normal rate, regular rhythm and normal heart sounds.   Pulmonary/Chest: Effort normal and breath sounds normal. No respiratory distress. She has no wheezes.    Assessment and Plan Kristy was seen today for follow-up and hypertension.  Diagnoses and all orders for this visit:  Uncontrolled hypertension   Improved with compliance Will continue at current dose    3 months follow up for BP monitoring Diangelo Radel A Exilda Wilhite

## 2017-02-11 ENCOUNTER — Ambulatory Visit: Payer: Medicare Other | Admitting: Family Medicine

## 2017-03-26 ENCOUNTER — Telehealth: Payer: Self-pay

## 2017-03-26 IMAGING — CR DG CERVICAL SPINE 2 OR 3 VIEWS
4 series · 4 of 4 positions shown · non-contrast
Comparison: 03/06/2010

CLINICAL DATA: Neck pain with right shoulder pain

EXAM:
CERVICAL SPINE - 2-3 VIEW

[lateral]
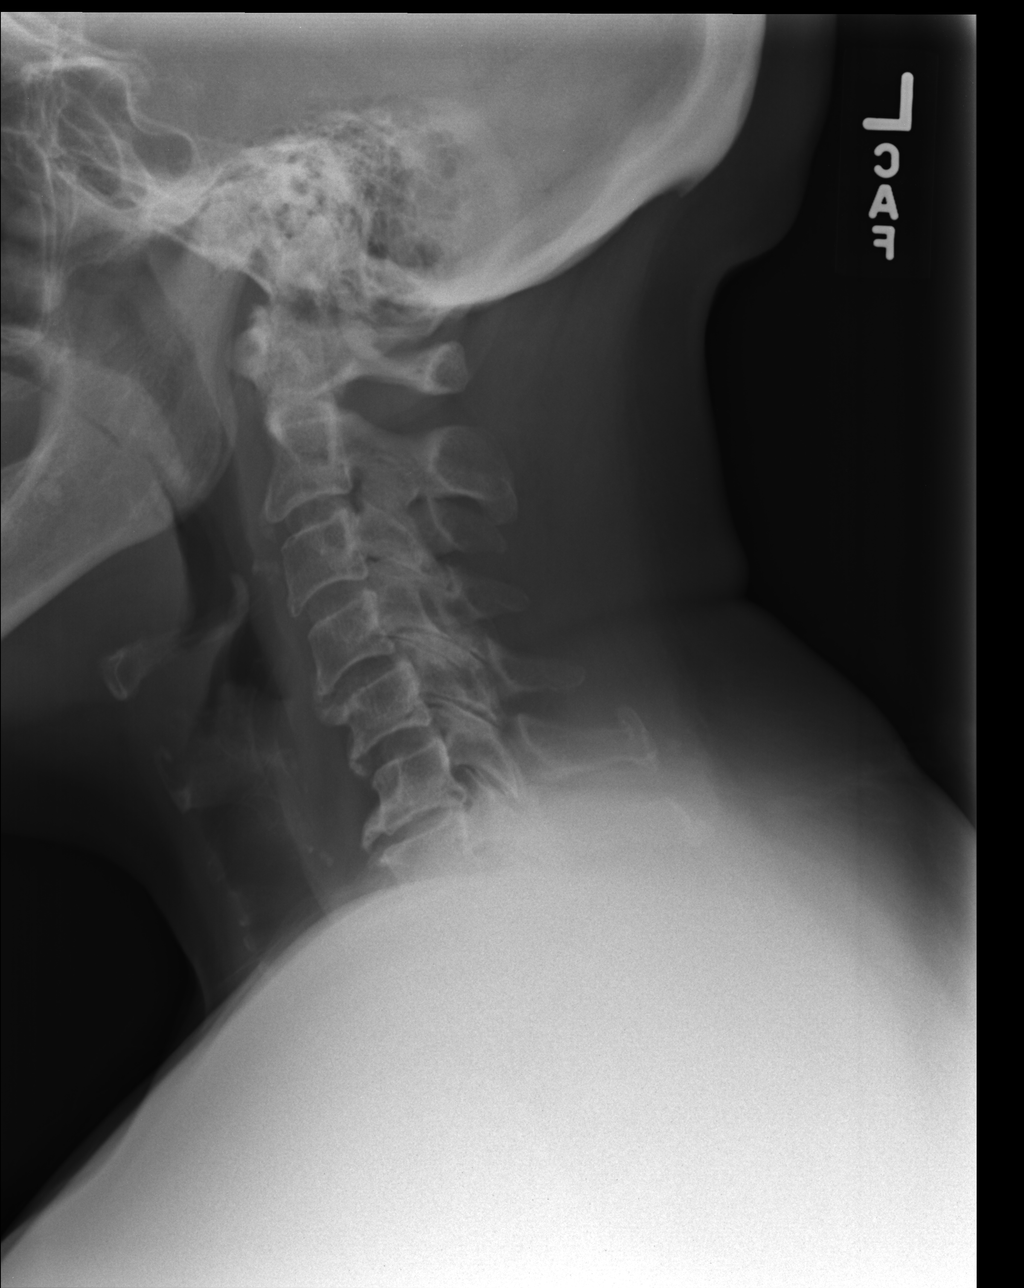

[AP]
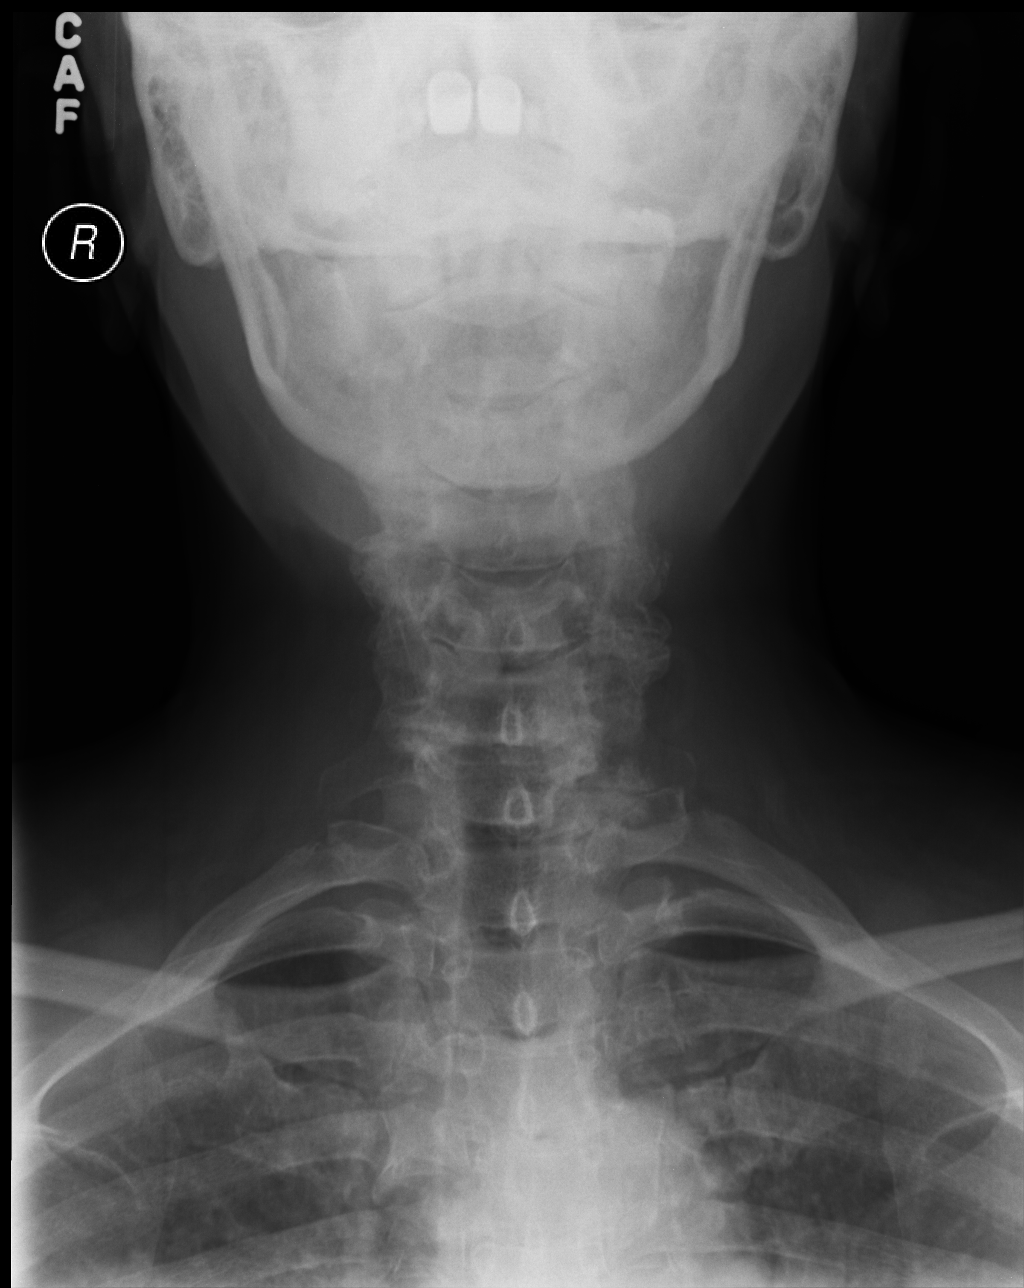

[ap open mouth]
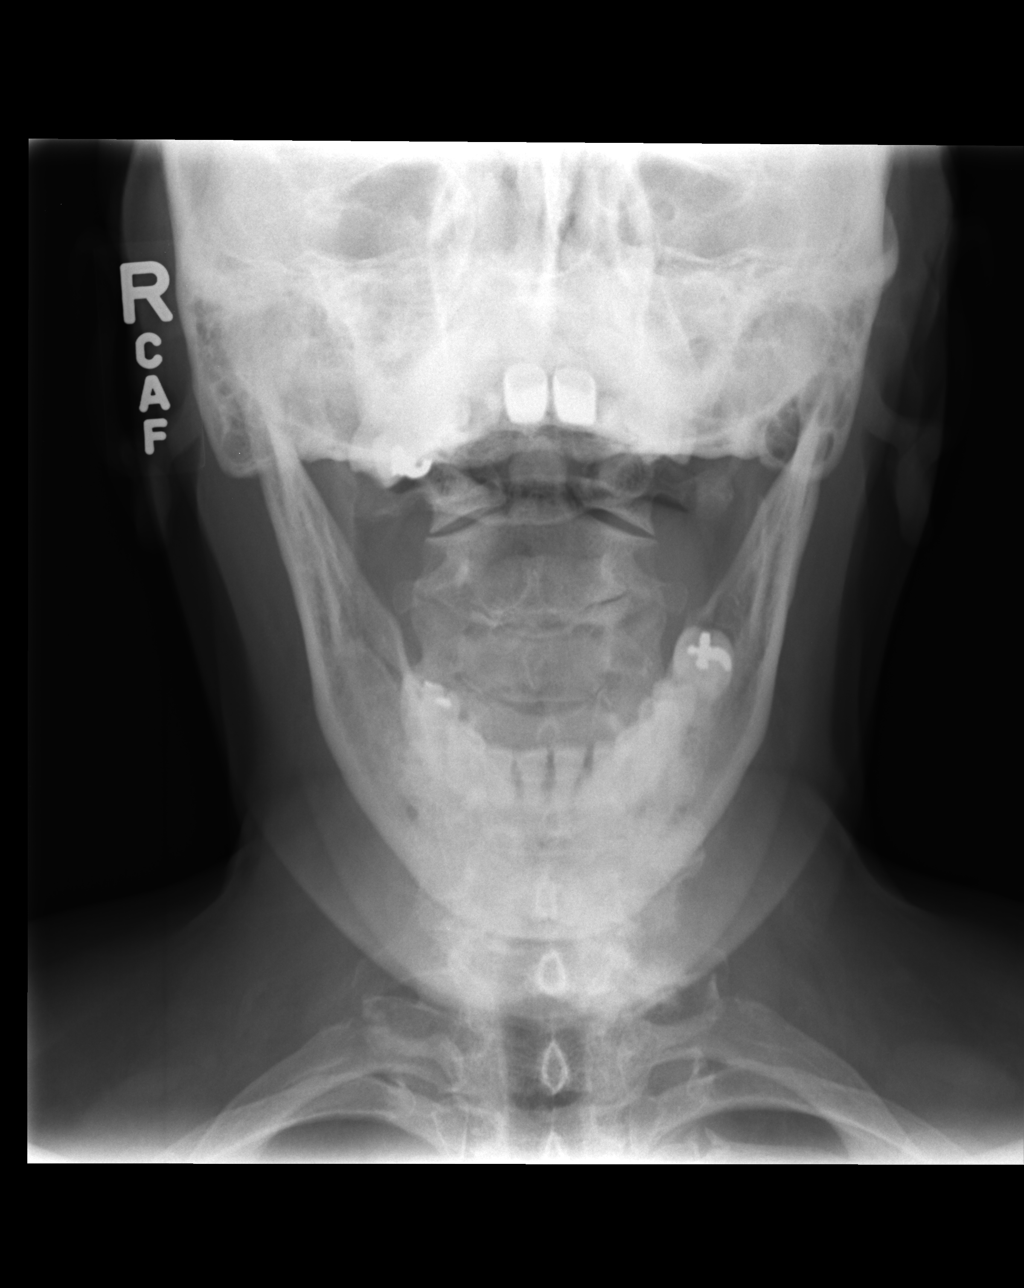

[swimmers]
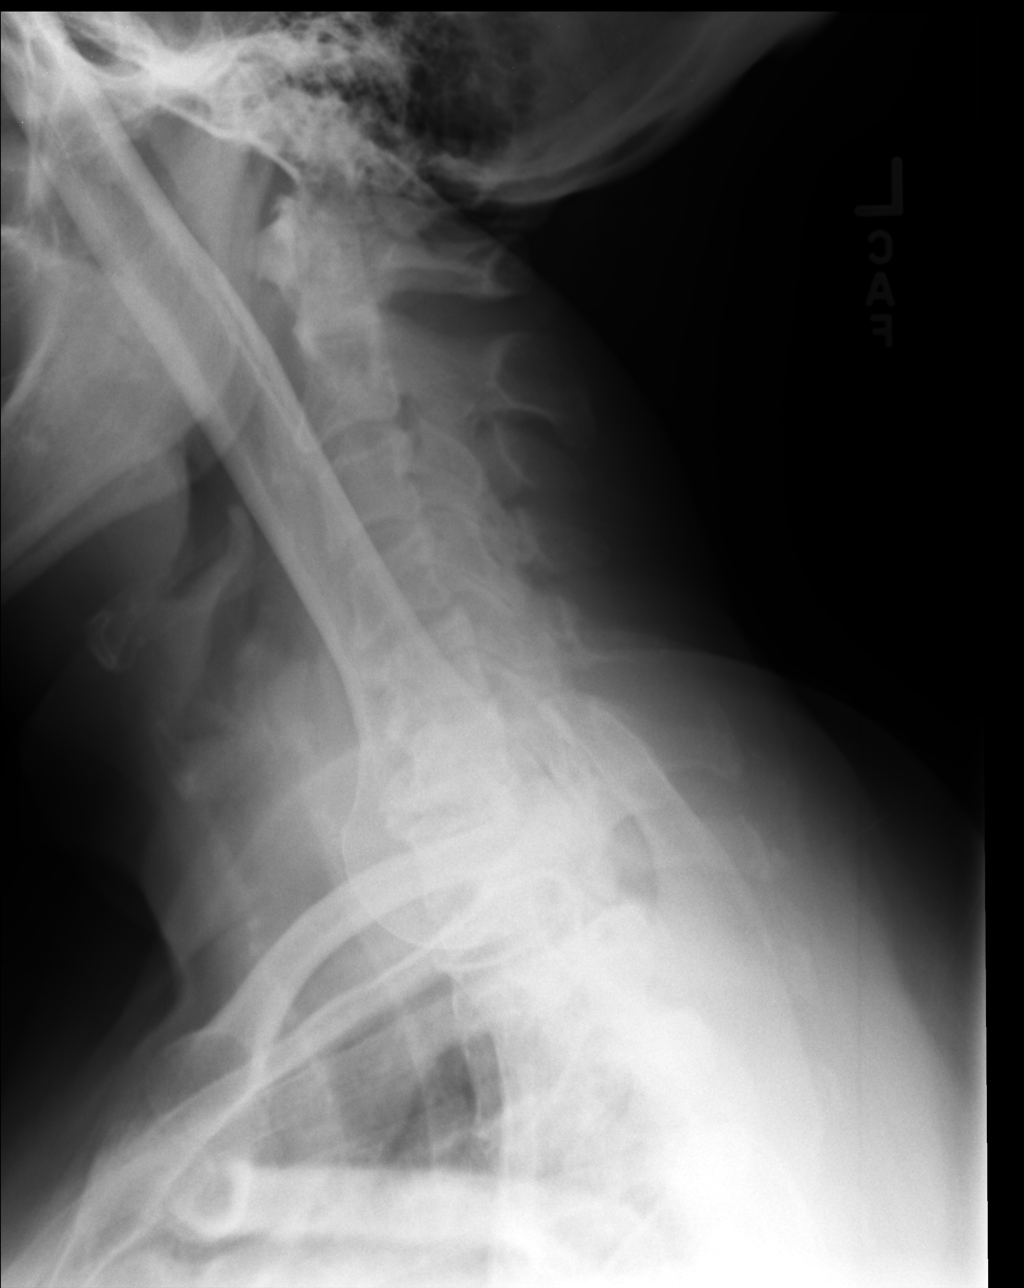

[4 of 4 positions shown; findings below may reference images not displayed]

FINDINGS: Cervical spine is visualized to the bottom of C7 on the lateral
view.

No evidence of fracture or dislocation. Vertebral heights are
maintained. Dens appears intact. Lateral masses C1 are symmetric.

No prevertebral soft tissue swelling.

Moderate degenerative changes of the lower cervical spine.

Visualized lung apices are clear.
IMPRESSION: No fracture or dislocation is seen.

Moderate degenerative changes of the lower cervical spine.

## 2017-03-26 NOTE — Telephone Encounter (Signed)
LMVM for patient to CB.  Left my direct line for her to call.

## 2017-04-09 ENCOUNTER — Other Ambulatory Visit: Payer: Self-pay | Admitting: Family Medicine

## 2017-04-09 DIAGNOSIS — I10 Essential (primary) hypertension: Secondary | ICD-10-CM

## 2017-05-06 ENCOUNTER — Ambulatory Visit: Payer: Medicare Other | Admitting: Family Medicine

## 2017-05-22 ENCOUNTER — Other Ambulatory Visit: Payer: Self-pay | Admitting: Physician Assistant

## 2017-07-22 ENCOUNTER — Encounter: Payer: Self-pay | Admitting: Family Medicine

## 2017-07-22 ENCOUNTER — Other Ambulatory Visit: Payer: Self-pay

## 2017-07-22 ENCOUNTER — Ambulatory Visit (INDEPENDENT_AMBULATORY_CARE_PROVIDER_SITE_OTHER): Payer: Medicare Other | Admitting: Family Medicine

## 2017-07-22 VITALS — BP 175/79 | HR 104 | Temp 98.4°F | Resp 18 | Ht 64.0 in | Wt 245.4 lb

## 2017-07-22 DIAGNOSIS — K5909 Other constipation: Secondary | ICD-10-CM | POA: Diagnosis not present

## 2017-07-22 DIAGNOSIS — E118 Type 2 diabetes mellitus with unspecified complications: Secondary | ICD-10-CM | POA: Diagnosis not present

## 2017-07-22 DIAGNOSIS — Z5181 Encounter for therapeutic drug level monitoring: Secondary | ICD-10-CM

## 2017-07-22 DIAGNOSIS — I10 Essential (primary) hypertension: Secondary | ICD-10-CM

## 2017-07-22 MED ORDER — LISINOPRIL 40 MG PO TABS: 40.0000 mg | ORAL_TABLET | Freq: Every day | ORAL | 1 refills | Status: DC

## 2017-07-22 MED ORDER — SPIRONOLACTONE 50 MG PO TABS: ORAL_TABLET | ORAL | 1 refills | Status: DC

## 2017-07-22 NOTE — Progress Notes (Signed)
Chief Complaint  Patient presents with  . Medication Refill    albuterol, pericolace, allegra, lisinopril, aldactone, test strips, polyethyl glycol-propyl glycol    HPI  Medication Refills   Hypertension: Patient here for follow-up of elevated blood pressure. She is not exercising and is not adherent to low salt diet.  Blood pressure is not well controlled at home but she is at her baseline and in fact she feels like this is good. Cardiac symptoms none. Patient denies chest pain, dyspnea, exertional chest pressure/discomfort, fatigue, irregular heart beat, lower extremity edema and palpitations.  Cardiovascular risk factors: advanced age (older than 61 for men, 69 for women) and hypertension. Use of agents associated with hypertension: none. History of target organ damage: none. Needs refills of lisinopril, aldactone BP Readings from Last 3 Encounters:  07/22/17 (!) 175/79  02/06/17 (!) 173/82  01/07/17 (!) 207/75   Lab Results  Component Value Date   CREATININE 0.75 07/22/2017    Chronic Constipation Pt reports that she has been having She reports that she takes it every day for years She states that she has had chronic constipation most of her life Her last colonoscopy was normal when she was in her 47s  Type 2 Diabetes Lab Results  Component Value Date   HGBA1C 8.3 (H) 07/22/2017   Patient reports that she checks her sugars periodically She states that she does not exercise but monitors what she eats. She      Past Medical History:  Diagnosis Date  . Arthritis   . Asthma    as child  . Chronic constipation   . Diabetes mellitus without complication (HCC)   . Fibromyalgia   . GERD (gastroesophageal reflux disease)   . Hypertension   . Wears glasses     Current Outpatient Medications  Medication Sig Dispense Refill  . Albuterol Sulfate (PROAIR RESPICLICK) 108 (90 Base) MCG/ACT AEPB Inhale 2 puffs into the lungs every 6 (six) hours as needed. 1 each 3  .  docusate-casanthranol (PERICOLACE) 100-30 MG per capsule Take 3 capsules by mouth daily.     . fexofenadine (ALLEGRA) 180 MG tablet Take 180 mg by mouth daily.    Marland Kitchen glucose blood (ONE TOUCH ULTRA TEST) test strip Test blood sugar once daily before breakfast. Dx code: R73.02 100 each 1  . Lancets (ONETOUCH ULTRASOFT) lancets Use as instructed 100 each 12  . lisinopril (PRINIVIL,ZESTRIL) 40 MG tablet Take 1 tablet (40 mg total) by mouth daily. 90 tablet 1  . Multiple Vitamins-Minerals (MULTIVITAMIN WITH MINERALS) tablet Take 1 tablet by mouth daily.    . ONE TOUCH ULTRA TEST test strip TEST BLOOD SUGAR ONCE DAILY BEFORE BREAKFAST 100 each 0  . Polyethyl Glycol-Propyl Glycol (SYSTANE OP) Apply to eye 2 (two) times daily.    Marland Kitchen spironolactone (ALDACTONE) 50 MG tablet TAKE 1 TABLET(50 MG) BY MOUTH DAILY 90 tablet 1   No current facility-administered medications for this visit.     Allergies:  Allergies  Allergen Reactions  . Penicillins Anaphylaxis    Has patient had a PCN reaction causing immediate rash, facial/tongue/throat swelling, SOB or lightheadedness with hypotension: yes Has patient had a PCN reaction causing severe rash involving mucus membranes or skin necrosis: no Has patient had a PCN reaction that required hospitalization: yes Has patient had a PCN reaction occurring within the last 10 years: No If all of the above answers are "NO", then may proceed with Cephalosporin use.   Marland Kitchen Bystolic [Nebivolol Hcl] Other (See Comments)  Headache   . Codeine     Causes ileus  . Thiazide-Type Diuretics Other (See Comments)    Hair loss    Past Surgical History:  Procedure Laterality Date  . ACHILLES TENDON SURGERY  07/21/2012   Procedure: ACHILLES TENDON REPAIR;  Surgeon: Harvie JuniorJohn L Graves, MD;  Location: [facility];  Service: Orthopedics;  Laterality: Left;   haglun and gastroc resection  . COLONOSCOPY    . CYSTOCELE REPAIR    . DILATION AND CURETTAGE OF UTERUS  62  . EYE  SURGERY    . TUBAL LIGATION      Social History   Socioeconomic History  . Marital status: Divorced    Spouse name: None  . Number of children: None  . Years of education: None  . Highest education level: None  Social Needs  . Financial resource strain: None  . Food insecurity - worry: None  . Food insecurity - inability: None  . Transportation needs - medical: None  . Transportation needs - non-medical: None  Occupational History  . Occupation: Retired-Licensed Engineering geologistractical Nurse    Employer: RETIRED    Comment: Retired  Tobacco Use  . Smoking status: Former Smoker    Last attempt to quit: 06/30/1984    Years since quitting: 33.0  . Smokeless tobacco: Never Used  Substance and Sexual Activity  . Alcohol use: Yes    Comment: Occasional wine drinker  . Drug use: No  . Sexual activity: Not Currently    Birth control/protection: Abstinence, Surgical  Other Topics Concern  . None  Social History Narrative   Exercises very little.    Family History  Problem Relation Age of Onset  . Diabetes Mother   . Hypertension Mother   . Diabetes Sister   . Hypertension Sister   . Thyroid disease Sister   . Thyroid disease Brother   . Asthma Son   . Asthma Grandchild   . Asthma Grandchild   . Asthma Grandchild      ROS Review of Systems See HPI Constitution: No fevers or chills No malaise No diaphoresis Skin: No rash or itching Eyes: no blurry vision, no double vision GU: no dysuria or hematuria Neuro: no dizziness or headaches all others reviewed and negative   Objective: Vitals:   07/22/17 1409  BP: (!) 175/79  Pulse: (!) 104  Resp: 18  Temp: 98.4 F (36.9 C)  TempSrc: Oral  SpO2: 96%  Weight: 245 lb 6.4 oz (111.3 kg)  Height: 5\' 4"  (1.626 m)    Physical Exam  Constitutional: She is oriented to person, place, and time. She appears well-developed and well-nourished.  HENT:  Head: Normocephalic and atraumatic.  Eyes: Conjunctivae and EOM are normal.    Cardiovascular: Normal rate, regular rhythm and normal heart sounds.  No murmur heard. Pulmonary/Chest: Effort normal and breath sounds normal. No stridor. No respiratory distress.  Neurological: She is alert and oriented to person, place, and time.  Skin: Skin is warm. Capillary refill takes less than 2 seconds.  Psychiatric: She has a normal mood and affect. Her behavior is normal. Judgment and thought content normal.    Assessment and Plan Nicholos JohnsKathleen was seen today for medication refill.  Diagnoses and all orders for this visit:  Uncontrolled hypertension- refilled current medications Continue home bp monitoring asymptomatic -     Comprehensive metabolic panel  Type 2 diabetes mellitus with complication, without long-term current use of insulin (HCC)- will check monitoring labs -     Lipid  panel -     Comprehensive metabolic panel -     Hemoglobin A1c  Encounter for medication monitoring  Constipation, chronic- continue colace otc  Essential hypertension  -     lisinopril (PRINIVIL,ZESTRIL) 40 MG tablet; Take 1 tablet (40 mg total) by mouth daily. -     spironolactone (ALDACTONE) 50 MG tablet; TAKE 1 TABLET(50 MG) BY MOUTH DAILY      Zoe A Stallings

## 2017-07-22 NOTE — Patient Instructions (Signed)
     IF you received an x-ray today, you will receive an invoice from [facility]. Please contact [facility] at [phone] with questions or concerns regarding your invoice.   IF you received labwork today, you will receive an invoice from [facility]. Please contact [facility] at [phone] with questions or concerns regarding your invoice.   Our billing staff will not be able to assist you with questions regarding bills from these companies.  You will be contacted with the lab results as soon as they are available. The fastest way to get your results is to activate your My Chart account. Instructions are located on the last page of this paperwork. If you have not heard from us regarding the results in 2 weeks, please contact this office.     

## 2017-07-23 LAB — COMPREHENSIVE METABOLIC PANEL
ALK PHOS: 84 IU/L (ref 39–117)
ALT: 26 IU/L (ref 0–32)
AST: 28 IU/L (ref 0–40)
Albumin/Globulin Ratio: 1.1 — ABNORMAL LOW (ref 1.2–2.2)
Albumin: 4.3 g/dL (ref 3.5–4.8)
BILIRUBIN TOTAL: 0.2 mg/dL (ref 0.0–1.2)
BUN / CREAT RATIO: 16 (ref 12–28)
BUN: 12 mg/dL (ref 8–27)
CHLORIDE: 101 mmol/L (ref 96–106)
CO2: 22 mmol/L (ref 20–29)
CREATININE: 0.75 mg/dL (ref 0.57–1.00)
Calcium: 9.7 mg/dL (ref 8.7–10.3)
GFR calc Af Amer: 92 mL/min/{1.73_m2} (ref >59)
GFR calc non Af Amer: 80 mL/min/{1.73_m2} (ref >59)
GLUCOSE: 141 mg/dL — AB (ref 65–99)
Globulin, Total: 3.8 g/dL (ref 1.5–4.5)
Potassium: 4.5 mmol/L (ref 3.5–5.2)
Sodium: 141 mmol/L (ref 134–144)
Total Protein: 8.1 g/dL (ref 6.0–8.5)

## 2017-07-23 LAB — LIPID PANEL
CHOL/HDL RATIO: 3.5 ratio (ref 0.0–4.4)
Cholesterol, Total: 216 mg/dL — ABNORMAL HIGH (ref 100–199)
HDL: 62 mg/dL (ref >39)
LDL Calculated: 129 mg/dL — ABNORMAL HIGH (ref 0–99)
Triglycerides: 127 mg/dL (ref 0–149)
VLDL CHOLESTEROL CAL: 25 mg/dL (ref 5–40)

## 2017-07-23 LAB — HEMOGLOBIN A1C
Est. average glucose Bld gHb Est-mCnc: 192 mg/dL
Hgb A1c MFr Bld: 8.3 % — ABNORMAL HIGH (ref 4.8–5.6)

## 2017-07-28 ENCOUNTER — Encounter: Payer: Self-pay | Admitting: Family Medicine

## 2017-08-06 DIAGNOSIS — H20021 Recurrent acute iridocyclitis, right eye: Secondary | ICD-10-CM | POA: Diagnosis not present

## 2017-08-17 DIAGNOSIS — H20021 Recurrent acute iridocyclitis, right eye: Secondary | ICD-10-CM | POA: Diagnosis not present

## 2017-09-07 DIAGNOSIS — H43811 Vitreous degeneration, right eye: Secondary | ICD-10-CM | POA: Diagnosis not present

## 2017-09-07 DIAGNOSIS — H20021 Recurrent acute iridocyclitis, right eye: Secondary | ICD-10-CM | POA: Diagnosis not present

## 2017-09-15 ENCOUNTER — Other Ambulatory Visit: Payer: Self-pay | Admitting: Family Medicine

## 2017-10-01 DIAGNOSIS — M5030 Other cervical disc degeneration, unspecified cervical region: Secondary | ICD-10-CM | POA: Diagnosis not present

## 2017-10-01 DIAGNOSIS — M5136 Other intervertebral disc degeneration, lumbar region: Secondary | ICD-10-CM | POA: Diagnosis not present

## 2017-10-05 DIAGNOSIS — H20021 Recurrent acute iridocyclitis, right eye: Secondary | ICD-10-CM | POA: Diagnosis not present

## 2017-11-02 NOTE — Progress Notes (Signed)
Chief Complaint  Patient presents with  . Medication Problem    spironolactone affecting k+ levels, fatigue and lethargic    HPI  Pt reports that she has fatigue and feels like she could doze off anytime She states that she eats and drinks adequately She does not have any activities to pass the time When asked if she had depressed mood or felt hopeless she replied "what is there to hope for and look forward to?" She reports that in [place] there is nothing to do so she plans to move back to a suburb of [place] She states that she moved to help raise her grandchildren who are now grown with their own lives and her children are grown.  She is retired and there is not much to do here  She denies  - chest pain - shortness of breath - pleuritic chest pain - nausea - vomiting - muscle weakness - diaphoresis - dizziness - suicidal ideation     Past Medical History:  Diagnosis Date  . Arthritis   . Asthma    as child  . Chronic constipation   . Diabetes mellitus without complication (HCC)   . Fibromyalgia   . GERD (gastroesophageal reflux disease)   . Hypertension   . Wears glasses     Current Outpatient Medications  Medication Sig Dispense Refill  . Albuterol Sulfate (PROAIR RESPICLICK) 108 (90 Base) MCG/ACT AEPB Inhale 2 puffs into the lungs every 6 (six) hours as needed. 1 each 3  . docusate-casanthranol (PERICOLACE) 100-30 MG per capsule Take 3 capsules by mouth daily.     Marland Kitchen glucose blood (ONE TOUCH ULTRA TEST) test strip Test blood sugar once daily before breakfast. Dx code: R73.02 100 each 1  . Lancets (ONETOUCH ULTRASOFT) lancets Use as instructed 100 each 12  . lisinopril (PRINIVIL,ZESTRIL) 40 MG tablet Take 1 tablet (40 mg total) by mouth daily. 90 tablet 1  . Multiple Vitamins-Minerals (MULTIVITAMIN WITH MINERALS) tablet Take 1 tablet by mouth daily.    . ONE TOUCH ULTRA TEST test strip USE TO TEST BLOOD SUGAR EVERY DAY BEFORE BREAKFAST 100 each 6  .  Polyethyl Glycol-Propyl Glycol (SYSTANE OP) Apply to eye 2 (two) times daily.    Marland Kitchen spironolactone (ALDACTONE) 50 MG tablet TAKE 1 TABLET(50 MG) BY MOUTH DAILY 90 tablet 1  . fexofenadine (ALLEGRA) 180 MG tablet Take 180 mg by mouth daily.     No current facility-administered medications for this visit.     Allergies:  Allergies  Allergen Reactions  . Penicillins Anaphylaxis    Has patient had a PCN reaction causing immediate rash, facial/tongue/throat swelling, SOB or lightheadedness with hypotension: yes Has patient had a PCN reaction causing severe rash involving mucus membranes or skin necrosis: no Has patient had a PCN reaction that required hospitalization: yes Has patient had a PCN reaction occurring within the last 10 years: No If all of the above answers are "NO", then may proceed with Cephalosporin use.   Marland Kitchen Bystolic [Nebivolol Hcl] Other (See Comments)    Headache   . Codeine     Causes ileus  . Thiazide-Type Diuretics Other (See Comments)    Hair loss    Past Surgical History:  Procedure Laterality Date  . ACHILLES TENDON SURGERY  07/21/2012   Procedure: ACHILLES TENDON REPAIR;  Surgeon: Harvie Junior, MD;  Location: [facility];  Service: Orthopedics;  Laterality: Left;   haglun and gastroc resection  . COLONOSCOPY    . CYSTOCELE REPAIR    .  DILATION AND CURETTAGE OF UTERUS  62  . EYE SURGERY    . TUBAL LIGATION      Social History   Socioeconomic History  . Marital status: Divorced    Spouse name: Not on file  . Number of children: Not on file  . Years of education: Not on file  . Highest education level: Not on file  Occupational History  . 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 Financial resource strain: Not on file  . Food insecurity:    Worry: Not on file    Inability: Not on file  . Transportation needs:    Medical: Not on file    Non-medical: Not on file  Tobacco Use    . Smoking status: Former Smoker    Last attempt to quit: 06/30/1984    Years since quitting: 33.3  . Smokeless tobacco: Never Used  Substance and Sexual Activity  . Alcohol use: Yes    Comment: Occasional wine drinker  . Drug use: No  . Sexual activity: Not Currently    Birth control/protection: Abstinence, Surgical  Lifestyle  . Physical activity:    Days per week: Not on file    Minutes per session: Not on file  . Stress: Not on file  Relationships  . Social connections:    Talks on phone: Not on file    Gets together: Not on file    Attends religious service: Not on file    Active member of club or organization: Not on file    Attends meetings of clubs or organizations: Not on file    Relationship status: Not on file  Other Topics Concern  . Not on file  Social History Narrative   Exercises very little.    Family History  Problem Relation Age of Onset  . Diabetes Mother   . Hypertension Mother   . Diabetes Sister   . Hypertension Sister   . Thyroid disease Sister   . Thyroid disease Brother   . Asthma Son   . Asthma Grandchild   . Asthma Grandchild   . Asthma Grandchild      ROS Review of Systems See HPI Constitution: No fevers or chills No malaise No diaphoresis Skin: No rash or itching Eyes: no blurry vision, no double vision GU: no dysuria or hematuria Neuro: no dizziness or headaches all others reviewed and negative   Objective: Vitals:   11/04/17 1148  BP: (!) 170/93  Pulse: 83  Resp: 16  Temp: 99.1 F (37.3 C)  TempSrc: Oral  SpO2: 96%  Weight: 247 lb 3.2 oz (112.1 kg)  Height:  (1.626 m)    Physical Exam  Constitutional: She is oriented to person, place, and time. She appears well-developed and well-nourished.  HENT:  Head: Normocephalic and atraumatic.  Eyes: Conjunctivae and EOM are normal.  Neck: Normal range of motion. Neck supple.  Cardiovascular: Normal rate, regular rhythm and normal heart sounds.  No murmur  heard. Pulmonary/Chest: Effort normal and breath sounds normal. No stridor. No respiratory distress. She has no wheezes. She has no rales.  Abdominal: Soft. Bowel sounds are normal. She exhibits no distension and no mass. There is no tenderness. There is no rebound and no guarding. No hernia.  Musculoskeletal: Normal range of motion. She exhibits no deformity.  Neurological: She is alert and oriented to person, place, and time.  Skin: Skin is warm. Capillary refill takes less than 2  seconds.  Psychiatric: She has a normal mood and affect. Her behavior is normal. Judgment and thought content normal.   Component     Latest Ref Rng & Units 11/04/2017  Glucose     65 - 99 mg/dL 865 (H)  BUN     8 - 27 mg/dL 10  Creatinine     7.84 - 1.00 mg/dL 6.96 (H)  GFR, Est Non African American     >59 mL/min/1.73 54 (L)  GFR, Est African American     >59 mL/min/1.73 62  BUN/Creatinine Ratio     12 - 28 10 (L)  Sodium     134 - 144 mmol/L 138  Potassium     3.5 - 5.2 mmol/L 5.1  Chloride     96 - 106 mmol/L 98  CO2     20 - 29 mmol/L 24  Calcium     8.7 - 10.3 mg/dL 29.5  Total Protein     6.0 - 8.5 g/dL 7.4  Albumin     3.5 - 4.8 g/dL 4.4  Globulin, Total     1.5 - 4.5 g/dL 3.0  Albumin/Globulin Ratio     1.2 - 2.2 1.5  Total Bilirubin     0.0 - 1.2 mg/dL 0.2  Alkaline Phosphatase     39 - 117 IU/L 75  AST     0 - 40 IU/L 31  ALT     0 - 32 IU/L 35 (H)   Component     Latest Ref Rng & Units 11/04/2017  POC Glucose     70 - 99 mg/dl 284 (A)   Component     Latest Ref Rng & Units 11/04/2017  WBC     4.6 - 10.2 K/uL 5.2  Lymph, poc     0.6 - 3.4 2.1  POC LYMPH PERCENT     10 - 50 %L 40.6  MID (cbc)     0 - 0.9 0.5  POC MID %     0 - 12 %M 9.0  POC Granulocyte     2 - 6.9 2.6  Granulocyte percent     37 - 80 %G 50.4  RBC     4.04 - 5.48 M/uL 4.81  Hemoglobin     12.2 - 16.2 g/dL 13.2  HCT     44.0 - 10.2 % 40.1  MCV     80 - 97 fL 83.4  MCH     27 - 31.2 pg 26.9  (A)  MCHC     31.8 - 35.4 g/dL 72.5  RDW, POC     % 36.6  Platelet Count, POC     142 - 424 K/uL 340  MPV     0 - 99.8 fL 8.1   Assessment and Plan Nicole Abbott was seen today for medication problem.  Diagnoses and all orders for this visit:  Fatigue, unspecified type- patient advised to continue current meds  Continue exercise  -     POCT CBC -     POCT glucose (manual entry) -     Cancel: POCT urinalysis dipstick -     Cancel: EKG 12-Lead -     Comprehensive metabolic panel  Type 2 diabetes mellitus with complication, without long-term current use of insulin (HCC)- no hypoglycemia -     HM Diabetes Foot Exam -     Comprehensive metabolic panel  Encounter for medication monitoring- refilled medications today -     Comprehensive metabolic panel  A total of 30 minutes were spent face-to-face with the patient during this encounter and over half of that time was spent on counseling and coordination of care.   Byers

## 2017-11-04 ENCOUNTER — Encounter: Payer: Self-pay | Admitting: Family Medicine

## 2017-11-04 ENCOUNTER — Ambulatory Visit (INDEPENDENT_AMBULATORY_CARE_PROVIDER_SITE_OTHER): Payer: Medicare Other | Admitting: Family Medicine

## 2017-11-04 ENCOUNTER — Other Ambulatory Visit: Payer: Self-pay

## 2017-11-04 VITALS — BP 170/93 | HR 83 | Temp 99.1°F | Resp 16 | Ht 64.0 in | Wt 247.2 lb

## 2017-11-04 DIAGNOSIS — E118 Type 2 diabetes mellitus with unspecified complications: Secondary | ICD-10-CM | POA: Diagnosis not present

## 2017-11-04 DIAGNOSIS — Z5181 Encounter for therapeutic drug level monitoring: Secondary | ICD-10-CM | POA: Diagnosis not present

## 2017-11-04 DIAGNOSIS — R5383 Other fatigue: Secondary | ICD-10-CM

## 2017-11-04 LAB — GLUCOSE, POCT (MANUAL RESULT ENTRY): POC Glucose: 191 mg/dl — AB (ref 70–99)

## 2017-11-04 LAB — POCT CBC
GRANULOCYTE PERCENT: 50.4 % (ref 37–80)
HCT, POC: 40.1 % (ref 37.7–47.9)
Hemoglobin: 12.9 g/dL (ref 12.2–16.2)
Lymph, poc: 2.1 (ref 0.6–3.4)
MCH, POC: 26.9 pg — AB (ref 27–31.2)
MCHC: 32.3 g/dL (ref 31.8–35.4)
MCV: 83.4 fL (ref 80–97)
MID (CBC): 0.5 (ref 0–0.9)
MPV: 8.1 fL (ref 0–99.8)
POC Granulocyte: 2.6 (ref 2–6.9)
POC LYMPH %: 40.6 % (ref 10–50)
POC MID %: 9 % (ref 0–12)
Platelet Count, POC: 340 10*3/uL (ref 142–424)
RBC: 4.81 M/uL (ref 4.04–5.48)
RDW, POC: 15.6 %
WBC: 5.2 10*3/uL (ref 4.6–10.2)

## 2017-11-04 NOTE — Patient Instructions (Signed)
     IF you received an x-ray today, you will receive an invoice from [facility]. Please contact  Radiology at [phone] with questions or concerns regarding your invoice.   IF you received labwork today, you will receive an invoice from [facility]. Please contact [facility] at [phone] with questions or concerns regarding your invoice.   Our billing staff will not be able to assist you with questions regarding bills from these companies.  You will be contacted with the lab results as soon as they are available. The fastest way to get your results is to activate your My Chart account. Instructions are located on the last page of this paperwork. If you have not heard from us regarding the results in 2 weeks, please contact this office.     

## 2017-11-05 LAB — COMPREHENSIVE METABOLIC PANEL
ALBUMIN: 4.4 g/dL (ref 3.5–4.8)
ALT: 35 IU/L — ABNORMAL HIGH (ref 0–32)
AST: 31 IU/L (ref 0–40)
Albumin/Globulin Ratio: 1.5 (ref 1.2–2.2)
Alkaline Phosphatase: 75 IU/L (ref 39–117)
BILIRUBIN TOTAL: 0.2 mg/dL (ref 0.0–1.2)
BUN / CREAT RATIO: 10 — AB (ref 12–28)
BUN: 10 mg/dL (ref 8–27)
CO2: 24 mmol/L (ref 20–29)
CREATININE: 1.03 mg/dL — AB (ref 0.57–1.00)
Calcium: 10 mg/dL (ref 8.7–10.3)
Chloride: 98 mmol/L (ref 96–106)
GFR calc non Af Amer: 54 mL/min/{1.73_m2} — ABNORMAL LOW (ref >59)
GFR, EST AFRICAN AMERICAN: 62 mL/min/{1.73_m2} (ref >59)
Globulin, Total: 3 g/dL (ref 1.5–4.5)
Glucose: 197 mg/dL — ABNORMAL HIGH (ref 65–99)
Potassium: 5.1 mmol/L (ref 3.5–5.2)
Sodium: 138 mmol/L (ref 134–144)
TOTAL PROTEIN: 7.4 g/dL (ref 6.0–8.5)

## 2017-11-25 ENCOUNTER — Encounter: Payer: Self-pay | Admitting: Family Medicine

## 2018-01-19 ENCOUNTER — Other Ambulatory Visit: Payer: Self-pay | Admitting: Family Medicine

## 2018-01-19 DIAGNOSIS — I10 Essential (primary) hypertension: Secondary | ICD-10-CM

## 2018-01-19 NOTE — Telephone Encounter (Signed)
Refill request for lisinopril  40 mg #90 0 refills and spironolactone 50 mg #90 0 refills approved. Dgaddy, CMA

## 2018-03-17 DIAGNOSIS — B029 Zoster without complications: Secondary | ICD-10-CM | POA: Diagnosis not present

## 2018-04-17 ENCOUNTER — Other Ambulatory Visit: Payer: Self-pay | Admitting: Family Medicine

## 2018-04-17 DIAGNOSIS — B029 Zoster without complications: Secondary | ICD-10-CM

## 2018-04-17 DIAGNOSIS — I10 Essential (primary) hypertension: Secondary | ICD-10-CM

## 2018-04-17 MED ORDER — GABAPENTIN 100 MG PO CAPS: 300.0000 mg | ORAL_CAPSULE | Freq: Every day | ORAL | 0 refills | Status: AC

## 2018-04-17 MED ORDER — SPIRONOLACTONE 50 MG PO TABS: ORAL_TABLET | ORAL | 0 refills | Status: AC

## 2018-04-17 MED ORDER — LISINOPRIL 40 MG PO TABS: ORAL_TABLET | ORAL | 0 refills | Status: DC

## 2018-05-24 ENCOUNTER — Other Ambulatory Visit: Payer: Self-pay | Admitting: Family Medicine

## 2018-05-24 DIAGNOSIS — I10 Essential (primary) hypertension: Secondary | ICD-10-CM

## 2018-05-24 NOTE — Telephone Encounter (Signed)
Copied from CRM [id number]. Topic: Quick Communication - Rx Refill/Question >> May 24, 2018 10:55 AM Stephannie LiSimmons, Caleesi Kohl L, NT wrote: Medication: lisinopril (PRINIVIL,ZESTRIL) 40 MG tablet  Has the patient contacted their pharmacy? yes  (Agent: If no, request that the patient contact the pharmacy for the refill. (Agent: If yes, when and what did the pharmacy advise?) Patient needed to call the practice , there are no more refills  Preferred Pharmacy (with phone number or street name  [id number] Ginette Otto- [place], [place] - [address] [facility] [address] [phone] (Phone) [phone] (Fax)    Agent: Please be advised that RX refills may take up to 3 business days. We ask that you follow-up with your pharmacy.

## 2018-05-25 MED ORDER — LISINOPRIL 40 MG PO TABS: ORAL_TABLET | ORAL | 0 refills | Status: AC

## 2018-05-25 NOTE — Telephone Encounter (Signed)
Requested medication (s) are due for refill today: Yes  Requested medication (s) are on the active medication list: Yes  Last refill:  04/17/18  Future visit scheduled: No  Notes to clinic:  Called to schedule OV - phone out of order.    Requested Prescriptions  Pending Prescriptions Disp Refills   lisinopril (PRINIVIL,ZESTRIL) 40 MG tablet 30 tablet 0    Sig: TAKE 1 TABLET(40 MG) BY MOUTH DAILY     Cardiovascular:  ACE Inhibitors Failed - 05/24/2018  3:06 PM      Failed - Cr in normal range and within 180 days    Creat  Date Value Ref Range Status  01/31/2016 0.82 0.60 - 0.93 mg/dL Final    Comment:      For patients > or = 73 years of age: The upper reference limit for Creatinine is approximately 13% higher for people identified as African-American.      Creatinine, Ser  Date Value Ref Range Status  11/04/2017 1.03 (H) 0.57 - 1.00 mg/dL Final         Failed - K in normal range and within 180 days    Potassium  Date Value Ref Range Status  11/04/2017 5.1 3.5 - 5.2 mmol/L Final         Failed - Last BP in normal range    BP Readings from Last 1 Encounters:  11/04/17 (!) 170/93         Failed - Valid encounter within last 6 months    Recent Outpatient Visits          6 months ago Fatigue, unspecified type   [facility], Manus RuddZoe A, MD   10 months ago Uncontrolled hypertension   [facility], Manus RuddZoe A, MD   1 year ago Uncontrolled hypertension   [facility] Manus RuddZoe A, MD   1 year ago Encounter for Medicare annual wellness exam   [facility], Manus RuddZoe A, MD   1 year ago Uncontrolled hypertension   [facility], Manus RuddZoe A, MD             Passed - Patient is not pregnant

## 2018-05-31 ENCOUNTER — Telehealth: Payer: Self-pay | Admitting: Family Medicine

## 2018-05-31 NOTE — Telephone Encounter (Signed)
Patients telephone number is disconnected. Tried to call son per dpr but no answer. Needing to know where she had her diabetic eye exam at so we can obtain those records.

## 2018-12-08 ENCOUNTER — Other Ambulatory Visit: Payer: Self-pay | Admitting: Family Medicine

## 2018-12-08 NOTE — Telephone Encounter (Signed)
Please review

## 2018-12-13 NOTE — Telephone Encounter (Signed)
Refilled the test strips today.Pt needs an OV for diabetic follow up for more refills.

## 2018-12-14 NOTE — Telephone Encounter (Signed)
BOTH NUMBERS ON FILE ARE DISCONNECTED
